# Patient Record
Sex: Female | Born: 1960 | Race: Black or African American | Hispanic: No | Marital: Married | Smoking: Never smoker
Health system: Southern US, Community
[De-identification: ages and names within clinical notes are randomized; demographics above are authoritative.]

---

## 2000-12-27 ENCOUNTER — Ambulatory Visit (HOSPITAL_COMMUNITY): Admission: RE | Admit: 2000-12-27 | Discharge: 2000-12-27 | Payer: Self-pay | Admitting: Obstetrics and Gynecology

## 2000-12-27 ENCOUNTER — Encounter: Payer: Self-pay | Admitting: Obstetrics and Gynecology

## 2000-12-28 ENCOUNTER — Other Ambulatory Visit: Admission: RE | Admit: 2000-12-28 | Discharge: 2000-12-28 | Payer: Self-pay | Admitting: Obstetrics and Gynecology

## 2002-03-16 ENCOUNTER — Encounter: Payer: Self-pay | Admitting: Obstetrics and Gynecology

## 2002-03-16 ENCOUNTER — Ambulatory Visit (HOSPITAL_COMMUNITY): Admission: RE | Admit: 2002-03-16 | Discharge: 2002-03-16 | Payer: Self-pay | Admitting: Obstetrics and Gynecology

## 2002-03-28 ENCOUNTER — Other Ambulatory Visit: Admission: RE | Admit: 2002-03-28 | Discharge: 2002-03-28 | Payer: Self-pay | Admitting: Obstetrics and Gynecology

## 2003-03-26 ENCOUNTER — Ambulatory Visit (HOSPITAL_COMMUNITY): Admission: RE | Admit: 2003-03-26 | Discharge: 2003-03-26 | Payer: Self-pay | Admitting: Obstetrics and Gynecology

## 2003-03-27 ENCOUNTER — Other Ambulatory Visit: Admission: RE | Admit: 2003-03-27 | Discharge: 2003-03-27 | Payer: Self-pay | Admitting: Obstetrics and Gynecology

## 2004-04-02 ENCOUNTER — Ambulatory Visit (HOSPITAL_COMMUNITY): Admission: RE | Admit: 2004-04-02 | Discharge: 2004-04-02 | Payer: Self-pay | Admitting: Obstetrics and Gynecology

## 2004-04-04 ENCOUNTER — Other Ambulatory Visit: Admission: RE | Admit: 2004-04-04 | Discharge: 2004-04-04 | Payer: Self-pay | Admitting: Obstetrics and Gynecology

## 2005-04-03 ENCOUNTER — Ambulatory Visit (HOSPITAL_COMMUNITY): Admission: RE | Admit: 2005-04-03 | Discharge: 2005-04-03 | Payer: Self-pay | Admitting: Obstetrics and Gynecology

## 2005-04-21 ENCOUNTER — Other Ambulatory Visit: Admission: RE | Admit: 2005-04-21 | Discharge: 2005-04-21 | Payer: Self-pay | Admitting: Obstetrics and Gynecology

## 2006-04-15 ENCOUNTER — Ambulatory Visit (HOSPITAL_COMMUNITY): Admission: RE | Admit: 2006-04-15 | Discharge: 2006-04-15 | Payer: Self-pay | Admitting: Obstetrics and Gynecology

## 2006-04-27 ENCOUNTER — Encounter: Admission: RE | Admit: 2006-04-27 | Discharge: 2006-04-27 | Payer: Self-pay | Admitting: Obstetrics and Gynecology

## 2006-05-11 ENCOUNTER — Other Ambulatory Visit: Admission: RE | Admit: 2006-05-11 | Discharge: 2006-05-11 | Payer: Self-pay | Admitting: Obstetrics and Gynecology

## 2007-04-27 ENCOUNTER — Ambulatory Visit (HOSPITAL_COMMUNITY): Admission: RE | Admit: 2007-04-27 | Discharge: 2007-04-27 | Payer: Self-pay | Admitting: Obstetrics and Gynecology

## 2010-02-02 ENCOUNTER — Encounter: Payer: Self-pay | Admitting: Obstetrics and Gynecology

## 2010-08-27 ENCOUNTER — Encounter (HOSPITAL_BASED_OUTPATIENT_CLINIC_OR_DEPARTMENT_OTHER)
Admission: RE | Admit: 2010-08-27 | Discharge: 2010-08-27 | Disposition: A | Discharge status: Home / Self Care | Payer: 59 | Source: Ambulatory Visit | Attending: Podiatry | Admitting: Podiatry

## 2010-08-28 ENCOUNTER — Ambulatory Visit (HOSPITAL_BASED_OUTPATIENT_CLINIC_OR_DEPARTMENT_OTHER)
Admission: RE | Admit: 2010-08-28 | Discharge: 2010-08-28 | Disposition: A | Discharge status: Home / Self Care | Payer: 59 | Source: Ambulatory Visit | Attending: Podiatry | Admitting: Podiatry

## 2010-08-28 DIAGNOSIS — Z01812 Encounter for preprocedural laboratory examination: Secondary | ICD-10-CM | POA: Insufficient documentation

## 2010-08-28 DIAGNOSIS — Z01810 Encounter for preprocedural cardiovascular examination: Secondary | ICD-10-CM | POA: Insufficient documentation

## 2010-08-28 DIAGNOSIS — M2040 Other hammer toe(s) (acquired), unspecified foot: Secondary | ICD-10-CM | POA: Insufficient documentation

## 2010-10-08 ENCOUNTER — Ambulatory Visit (HOSPITAL_COMMUNITY)
Admission: RE | Admit: 2010-10-08 | Discharge: 2010-10-08 | Disposition: A | Discharge status: Home / Self Care | Payer: 59 | Source: Ambulatory Visit | Attending: Gastroenterology | Admitting: Gastroenterology

## 2010-10-08 DIAGNOSIS — G4700 Insomnia, unspecified: Secondary | ICD-10-CM | POA: Insufficient documentation

## 2010-10-08 DIAGNOSIS — R198 Other specified symptoms and signs involving the digestive system and abdomen: Secondary | ICD-10-CM | POA: Insufficient documentation

## 2010-10-08 DIAGNOSIS — K5900 Constipation, unspecified: Secondary | ICD-10-CM | POA: Insufficient documentation

## 2010-10-08 DIAGNOSIS — Z79899 Other long term (current) drug therapy: Secondary | ICD-10-CM | POA: Insufficient documentation

## 2010-10-10 ENCOUNTER — Other Ambulatory Visit: Payer: Self-pay | Admitting: Gastroenterology

## 2010-10-13 ENCOUNTER — Other Ambulatory Visit (HOSPITAL_COMMUNITY): Payer: Self-pay | Admitting: Gastroenterology

## 2010-10-13 DIAGNOSIS — K6289 Other specified diseases of anus and rectum: Secondary | ICD-10-CM

## 2010-10-14 ENCOUNTER — Other Ambulatory Visit: Payer: 59

## 2010-10-16 ENCOUNTER — Ambulatory Visit (HOSPITAL_COMMUNITY)
Admission: RE | Admit: 2010-10-16 | Discharge: 2010-10-16 | Disposition: A | Discharge status: Home / Self Care | Payer: 59 | Source: Ambulatory Visit | Attending: Gastroenterology | Admitting: Gastroenterology

## 2010-10-16 DIAGNOSIS — K6289 Other specified diseases of anus and rectum: Secondary | ICD-10-CM

## 2010-10-16 DIAGNOSIS — R935 Abnormal findings on diagnostic imaging of other abdominal regions, including retroperitoneum: Secondary | ICD-10-CM | POA: Insufficient documentation

## 2010-10-16 MED ORDER — IOHEXOL 300 MG/ML  SOLN
100.0000 mL | Freq: Once | INTRAMUSCULAR | Status: AC | PRN
  Administered 2010-10-16: 100 mL via INTRAVENOUS

## 2015-02-06 MED FILL — MEDROXYPROGESTERONE 5 MG TA: 5 | 90 days supply | Qty: 90 | Fill #4

## 2015-02-06 MED FILL — ESTRADIOL 2 MG TABLET: 2 | 90 days supply | Qty: 90 | Fill #4

## 2015-05-07 MED FILL — MEDROXYPROGESTERONE 5 MG TA: 5 | 90 days supply | Qty: 90 | Fill #0

## 2015-05-09 MED FILL — ESTRADIOL 2 MG TABLET: 2 | 90 days supply | Qty: 90 | Fill #0

## 2015-08-07 MED FILL — MEDROXYPROGESTERONE 5 MG TA: 5 | 90 days supply | Qty: 90 | Fill #1

## 2015-08-07 MED FILL — ESTRADIOL 2 MG TABLET: 2 | 90 days supply | Qty: 90 | Fill #1

## 2015-09-18 ENCOUNTER — Emergency Department (HOSPITAL_COMMUNITY)
Admission: EM | Admit: 2015-09-18 | Discharge: 2015-09-18 | Disposition: A | Discharge status: Home / Self Care | Payer: PRIVATE HEALTH INSURANCE | Attending: Emergency Medicine | Admitting: Emergency Medicine

## 2015-09-18 ENCOUNTER — Encounter (HOSPITAL_COMMUNITY): Payer: Self-pay | Admitting: Emergency Medicine

## 2015-09-18 DIAGNOSIS — Y929 Unspecified place or not applicable: Secondary | ICD-10-CM | POA: Diagnosis not present

## 2015-09-18 DIAGNOSIS — R51 Headache: Secondary | ICD-10-CM | POA: Diagnosis present

## 2015-09-18 DIAGNOSIS — W19XXXA Unspecified fall, initial encounter: Secondary | ICD-10-CM

## 2015-09-18 DIAGNOSIS — Y990 Civilian activity done for income or pay: Secondary | ICD-10-CM | POA: Diagnosis not present

## 2015-09-18 DIAGNOSIS — W109XXA Fall (on) (from) unspecified stairs and steps, initial encounter: Secondary | ICD-10-CM | POA: Insufficient documentation

## 2015-09-18 DIAGNOSIS — W01198A Fall on same level from slipping, tripping and stumbling with subsequent striking against other object, initial encounter: Secondary | ICD-10-CM | POA: Insufficient documentation

## 2015-09-18 DIAGNOSIS — Y939 Activity, unspecified: Secondary | ICD-10-CM | POA: Diagnosis not present

## 2015-09-18 NOTE — Discharge Instructions (Signed)
As discussed, your evaluation today has been largely reassuring.  But, it is important that you monitor your condition carefully, and do not hesitate to return to the ED if you develop new, or concerning changes in your condition. ? ?Otherwise, please follow-up with your physician for appropriate ongoing care. ? ?

## 2015-09-18 NOTE — ED Provider Notes (Signed)
[facility] DEPT Provider Note   CSN: [id number] Arrival date & time: 09/18/15  1146     History   Chief Complaint Chief Complaint  Patient presents with  . Fall    HPI Valerie Spence is a 55 y.o. female.  HPI Patient presents after mechanical fall. Patient recall the entirety of the event. Patient was in her usual state of health prior to falling. She recalls slipping on water, falling onto her backside, subsequent hitting her head. No loss of consciousness, no subsequent confusion, disorientation, vision changes. She did have pain in the posterior head briefly after the event, though has no current complaints, including no head pain, neck pain, weakness in any extremity. No medication taken for relief.  History reviewed. No pertinent past medical history.  There are no active problems to display for this patient.   History reviewed. No pertinent surgical history.  OB History    No data available       Home Medications    Prior to Admission medications   Not on File    Family History No family history on file.  Social History Social History  Substance Use Topics  . Smoking status: Not on file  . Smokeless tobacco: Not on file  . Alcohol use Not on file     Allergies   Review of patient's allergies indicates not on file.   Review of Systems Review of Systems  Constitutional:       Per HPI, otherwise negative  HENT:       Per HPI, otherwise negative  Respiratory:       Per HPI, otherwise negative  Cardiovascular:       Per HPI, otherwise negative  Gastrointestinal: Negative for vomiting.  Endocrine:       Negative aside from HPI  Genitourinary:       Neg aside from HPI   Musculoskeletal:       Per HPI, otherwise negative  Skin: Negative.   Neurological: Positive for headaches. Negative for syncope.     Physical Exam Updated Vital Signs BP (!) 128/53 (BP Location: Left Arm)   Pulse (!) 54   Temp 97.9 F (36.6 C) (Oral)   Resp 16    SpO2 100%   Physical Exam  Constitutional: She is oriented to person, place, and time. She appears well-developed and well-nourished. No distress.  HENT:  Head: Normocephalic and atraumatic.  Eyes: Conjunctivae and EOM are normal.  Neck: No spinous process tenderness and no muscular tenderness present. No neck rigidity. Normal range of motion present.  Cardiovascular: Normal rate and regular rhythm.   Pulmonary/Chest: Effort normal and breath sounds normal. No stridor. No respiratory distress.  Abdominal: She exhibits no distension.  Musculoskeletal: She exhibits no edema.  Neurological: She is alert and oriented to person, place, and time. She displays no atrophy and no tremor. No cranial nerve deficit. She exhibits normal muscle tone. She displays no seizure activity.  Skin: Skin is warm and dry.  Psychiatric: She has a normal mood and affect.  Nursing note and vitals reviewed.    ED Treatments / Results   Procedures Procedures (including critical care time)   Initial Impression / Assessment and Plan / ED Course  I have reviewed the triage vital signs and the nursing notes.  Pertinent labs & imaging results that were available during my care of the patient were reviewed by me and considered in my medical decision making (see chart for details).  Well-appearing female presents after mechanical fall.  Patient has full recall of the event, has no ongoing pain, nor neurologic deficits. Patient takes no blood thinning medication. With reassuring physical exam, unremarkable vitals imaging is not indicated. Patient discharged in stable condition.   Carmin Muskrat, MD 09/18/15 1246

## 2015-09-18 NOTE — ED Notes (Signed)
Bed: [id number] Expected date:  Expected time:  Means of arrival:  Comments: [employer] Employee

## 2015-09-18 NOTE — ED Triage Notes (Signed)
Pt was upstairs walking, slipped on water and had a fall. Pt fell backwards and hit back of head on wall. No LOC or blood thinners. Pt denies pain, had an initial headache that is now gone.

## 2015-09-18 NOTE — Progress Notes (Signed)
Pt confirms pcp as [name],  Pt fell at work. Pt noted to ambulate to door  Pt with primary care giver at home if needed, assistance to get medications, food and to f/u care  As CM was speaking with pt ED NP/AP came to evaluate pt

## 2015-09-19 ENCOUNTER — Telehealth: Payer: Self-pay | Admitting: *Deleted

## 2015-11-06 MED FILL — MEDROXYPROGESTERONE 5 MG TA: 5 | 90 days supply | Qty: 90 | Fill #2

## 2015-11-06 MED FILL — ESTRADIOL 2 MG TABLET: 2 | 90 days supply | Qty: 90 | Fill #2

## 2015-12-10 DIAGNOSIS — Z0000 Encounter for general adult medical examination without abnormal findings: Secondary | ICD-10-CM | POA: Diagnosis not present

## 2015-12-10 DIAGNOSIS — Z13220 Encounter for screening for lipoid disorders: Secondary | ICD-10-CM | POA: Diagnosis not present

## 2015-12-10 DIAGNOSIS — Z130 Encounter for screening for diseases of the blood and blood-forming organs and certain disorders involving the immune mechanism: Secondary | ICD-10-CM | POA: Diagnosis not present

## 2016-01-03 ENCOUNTER — Other Ambulatory Visit (HOSPITAL_COMMUNITY): Payer: Self-pay | Admitting: Orthopedic Surgery

## 2016-01-03 DIAGNOSIS — M5416 Radiculopathy, lumbar region: Secondary | ICD-10-CM

## 2016-01-07 ENCOUNTER — Other Ambulatory Visit (HOSPITAL_COMMUNITY): Payer: Self-pay | Admitting: Orthopedic Surgery

## 2016-01-07 DIAGNOSIS — M5416 Radiculopathy, lumbar region: Secondary | ICD-10-CM

## 2016-01-11 ENCOUNTER — Ambulatory Visit (HOSPITAL_BASED_OUTPATIENT_CLINIC_OR_DEPARTMENT_OTHER)
Admission: RE | Admit: 2016-01-11 | Discharge: 2016-01-11 | Disposition: A | Discharge status: Home / Self Care | Payer: PRIVATE HEALTH INSURANCE | Source: Ambulatory Visit | Attending: Orthopedic Surgery | Admitting: Orthopedic Surgery

## 2016-01-11 ENCOUNTER — Encounter (HOSPITAL_BASED_OUTPATIENT_CLINIC_OR_DEPARTMENT_OTHER): Payer: Self-pay

## 2016-01-11 DIAGNOSIS — M5116 Intervertebral disc disorders with radiculopathy, lumbar region: Secondary | ICD-10-CM | POA: Insufficient documentation

## 2016-01-11 DIAGNOSIS — M48061 Spinal stenosis, lumbar region without neurogenic claudication: Secondary | ICD-10-CM | POA: Diagnosis not present

## 2016-01-11 DIAGNOSIS — M5416 Radiculopathy, lumbar region: Secondary | ICD-10-CM | POA: Diagnosis present

## 2016-01-21 MED FILL — METHOCARBAMOL 500 MG TABLET: 500 | 20 days supply | Qty: 60 | Fill #0

## 2016-01-21 MED FILL — METHYLPREDNISOLONE 4 MG TAB: 4 | 6 days supply | Qty: 21 | Fill #0

## 2016-02-03 MED FILL — ESTRADIOL 2 MG TABLET: 2 | 90 days supply | Qty: 90 | Fill #3

## 2016-02-04 MED FILL — MEDROXYPROGESTERONE 5 MG TA: 5 | 90 days supply | Qty: 90 | Fill #0

## 2016-02-11 ENCOUNTER — Ambulatory Visit: Payer: PRIVATE HEALTH INSURANCE | Attending: Family Medicine | Admitting: Physical Therapy

## 2016-02-11 DIAGNOSIS — M25652 Stiffness of left hip, not elsewhere classified: Secondary | ICD-10-CM | POA: Diagnosis present

## 2016-02-11 DIAGNOSIS — R208 Other disturbances of skin sensation: Secondary | ICD-10-CM | POA: Diagnosis present

## 2016-02-11 DIAGNOSIS — R252 Cramp and spasm: Secondary | ICD-10-CM | POA: Diagnosis present

## 2016-02-11 DIAGNOSIS — M79605 Pain in left leg: Secondary | ICD-10-CM | POA: Diagnosis present

## 2016-02-11 NOTE — Therapy (Signed)
[place], [place], [address] Phone: [phone]   Fax:  [phone]  Physical Therapy Evaluation  Patient Details  Name: Valerie Spence MRN: [id number] Date of Birth: [birth year] Referring Provider: Dr. Lynann Bologna  Encounter Date: 02/11/2016      PT End of Session - 02/11/16 1842    Visit Number 1   Number of Visits 12   Date for PT Re-Evaluation 04/07/16   PT Start Time 2563   PT Stop Time 1635   PT Time Calculation (min) 53 min   Activity Tolerance Patient tolerated treatment well   Behavior During Therapy [facility] for tasks assessed/performed      No past medical history on file.  No past surgical history on file.  There were no vitals filed for this visit.       Subjective Assessment - 02/11/16 1547    Subjective Pt fell at work in Sept. 2017, slipped on wet floor and fell on her bottom and the back of her head hit the wall.  She has had burning pain and tingling in LLE since Nov.  in LLE.  She denies weakness.  This prevents her from sitting any length of time.  This impacts her ability to finish her work, has to change positions frequently.  She is able to do her normal routine at home.    Limitations Sitting;Other (comment)   How long can you sit comfortably? 15 min max    Diagnostic tests MRI showed L4-L5 disc Soft disc protrusion at L4-5 asymmetric to the left   Patient Stated Goals Pt would to alleviate her pain completely.    Currently in Pain? No/denies   Pain Score 9   when she puts pressure in it in sitting    Pain Location Leg   Pain Orientation Left;Posterior;Proximal   Pain Descriptors / Indicators Burning;Tingling;Shooting   Pain Type Chronic pain   Pain Radiating Towards knee    Pain Onset More than a month ago   Pain Frequency Intermittent   Aggravating Factors  sitting    Pain Relieving Factors standing    Effect of Pain on Daily Activities slows her down, pain can be  excruciating             [facility] PT Assessment - 02/11/16 1555      Assessment   Medical Diagnosis lumbar radiculopathy   Referring Provider Dr. Lynann Bologna   Onset Date/Surgical Date 09/18/15   Prior Therapy No      Precautions   Precautions None     Restrictions   Weight Bearing Restrictions No     Balance Screen   Has the patient fallen in the past 6 months Yes     [place] residence     Prior Function   Level of Independence Independent     Cognition   Overall Cognitive Status Within Functional Limits for tasks assessed     Observation/Other Assessments   Focus on Therapeutic Outcomes (FOTO)  NT     Sensation   Light Touch Appears Intact     Posture/Postural Control   Posture Comments Rt. iliac crest higher and L leg shorter      AROM   Right/Left Hip --  WNL    Lumbar Flexion WNL   Lumbar Extension WNL    Lumbar - Right Side Bend WNL   Lumbar - Left Side Bend WNL   Lumbar - Right Rotation WNL   Lumbar -  Left Rotation WNL     PROM   Overall PROM Comments Rt. 85, Lt 70 deg      Strength   Right Hip Flexion 5/5   Right Hip Extension 4+/5   Right Hip ABduction 4/5   Left Hip Flexion 5/5   Left Hip Extension 4/5   Left Hip ABduction 3+/5   Right Knee Flexion 5/5   Right Knee Extension 5/5   Left Knee Flexion 5/5   Left Knee Extension 5/5   Right Ankle Dorsiflexion 5/5   Left Ankle Dorsiflexion 5/5     Flexibility   Hamstrings 90/90 Rt. 18, Lt 35   Piriformis tight      Palpation   Spinal mobility normal, discomfort but no pain    SI assessment  no pain, Lt ASIS lower    Palpation comment crepitus in knees with ext., MMT      FABER test   findings Negative     Prone Knee Bend Test   Findings Negative     Straight Leg Raise   Findings Negative                   [facility] Adult PT Treatment/Exercise - 02/11/16 1555      Lumbar Exercises: Stretches   Active Hamstring Stretch 1 rep   Prone on  Elbows Stretch 1 rep;60 seconds   Piriformis Stretch 1 rep     Manual Therapy   Muscle Energy Technique Resisted hip flexion x 8 sec x 10 in supine                 PT Education - 02/11/16 1842    Education provided Yes   Education Details PT/POC, HEP and SIJ, anatomy, directional preferences    Person(s) Educated Patient   Methods Explanation;Demonstration;Tactile cues;Verbal cues;Handout   Comprehension Verbalized understanding;Need further instruction          PT Short Term Goals - 02/11/16 1854      PT SHORT TERM GOAL #1   Title Pt will be I with initial HEP for lumbar, core stability anf flexibility.    Time 3   Period Weeks   Status New     PT SHORT TERM GOAL #2   Title Pt will report ability to sit for 15 min (for commute to work) with only min pain in LE    Time 3   Period Weeks   Status New     PT SHORT TERM GOAL #3   Title Pt wil complete FOTO and set goal for measurement of functional progress.    Time 1   Period Weeks   Status New           PT Long Term Goals - 02/11/16 1856      PT LONG TERM GOAL #1   Title Pt will be able to sit as long as needed for work activity (notes), driving and pain in LE <3/10.   Time 8   Period Weeks   Status New     PT LONG TERM GOAL #2   Title Pt will be I with more advanced HEP and concepts of posture and body mechanics to prevent further injury.    Time 8   Period Weeks   Status New     PT LONG TERM GOAL #3   Title Pt will demo LLE hamstring 90/90 test within 5 deg of each other    Baseline Rt. 18, Lt. 35   Time 8   Period  Weeks   Status New     PT LONG TERM GOAL #4   Title Pt will demo 4+/5 or more hip abd and ext strength to maximize stability in gait, exercise.     Baseline 3+/5 to 4/5   Time 8   Period Weeks   Status New     PT LONG TERM GOAL #5   Title FOTO goal to be set when avail.                Plan - 02/11/16 1843    Clinical Impression Statement Patient presents for low  complexity eval of lumbar radiculopathy with only LLE symptoms in seated position.  She can eliminate her pain with standing.  She does also have an asymmetry of her leg length and bony landmarks.  Today I performed a MET to correct a post innominate on L side, this may be part of the issue as she did fall on her backside.  Mild weakness in L gluteals and she has decent lower abdominal strength.  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    Rehab Potential Excellent   PT Frequency 2x / week   PT Duration 8 weeks  allow 8 weeks for 12 visit    PT Treatment/Interventions ADLs/Self Care Home Management;Cryotherapy;Electrical Stimulation;Functional mobility training;Passive range of motion;Neuromuscular re-education;Ultrasound;Traction;Manual techniques;Therapeutic activities;Moist Heat;Taping   PT Next Visit Plan check HEP, manual, see if MET made a diff (check Leg length)    PT Home Exercise Plan hamstring, piriformis and MET, prone ext    Consulted and Agree with Plan of Care Patient      Patient will benefit from skilled therapeutic intervention in order to improve the following deficits and impairments:  Increased muscle spasms, Pain, Impaired flexibility, Decreased mobility, Decreased strength, Postural dysfunction, Impaired sensation  Visit Diagnosis: Pain in left leg  Other disturbances of skin sensation  Cramp and spasm  Stiffness of left hip, not elsewhere classified     Problem List There are no active problems to display for this patient.   Mega Kinkade 02/11/2016, 7:20 PM  [facility] [address] [place], [place], [address] Phone: [phone]   Fax:  [phone]  Name: Valerie Spence MRN: [id number] Date of Birth: [birth year]

## 2016-02-14 DIAGNOSIS — Z1231 Encounter for screening mammogram for malignant neoplasm of breast: Secondary | ICD-10-CM | POA: Diagnosis not present

## 2016-02-14 DIAGNOSIS — Z6822 Body mass index (BMI) 22.0-22.9, adult: Secondary | ICD-10-CM | POA: Diagnosis not present

## 2016-02-14 DIAGNOSIS — Z01419 Encounter for gynecological examination (general) (routine) without abnormal findings: Secondary | ICD-10-CM | POA: Diagnosis not present

## 2016-02-14 DIAGNOSIS — Z124 Encounter for screening for malignant neoplasm of cervix: Secondary | ICD-10-CM | POA: Diagnosis not present

## 2016-02-17 ENCOUNTER — Ambulatory Visit: Payer: PRIVATE HEALTH INSURANCE | Attending: Family Medicine | Admitting: Physical Therapy

## 2016-02-17 DIAGNOSIS — R208 Other disturbances of skin sensation: Secondary | ICD-10-CM | POA: Diagnosis present

## 2016-02-17 DIAGNOSIS — M25652 Stiffness of left hip, not elsewhere classified: Secondary | ICD-10-CM | POA: Insufficient documentation

## 2016-02-17 DIAGNOSIS — R252 Cramp and spasm: Secondary | ICD-10-CM | POA: Diagnosis present

## 2016-02-17 DIAGNOSIS — M79605 Pain in left leg: Secondary | ICD-10-CM | POA: Insufficient documentation

## 2016-02-17 NOTE — Therapy (Signed)
[place], [place], [address] Phone: [phone]   Fax:  [phone]  Physical Therapy Treatment  Patient Details  Name: Valerie Spence MRN: [id number] Date of Birth: [birth year] Referring Provider: Dr. Lynann Bologna  Encounter Date: 02/17/2016      PT End of Session - 02/17/16 1350    Visit Number 2   Number of Visits 12   Date for PT Re-Evaluation 04/07/16   PT Start Time 5329   PT Stop Time 1418   PT Time Calculation (min) 43 min   Activity Tolerance Patient tolerated treatment well   Behavior During Therapy [facility] for tasks assessed/performed      No past medical history on file.  No past surgical history on file.  There were no vitals filed for this visit.      Subjective Assessment - 02/17/16 1340    Subjective The exercises helped her, including the MET.   No pain today.     Currently in Pain? No/denies            [facility] Adult PT Treatment/Exercise - 02/17/16 1344      Lumbar Exercises: Stretches   Active Hamstring Stretch 2 reps;30 seconds   Active Hamstring Stretch Limitations seated   Pelvic Tilt 5 reps   Piriformis Stretch 3 reps;30 seconds     Lumbar Exercises: Supine   Ab Set 10 reps   Clam 10 reps   Heel Slides 10 reps   Bent Knee Raise 10 reps   Bridge 10 reps     Manual Therapy   Joint Mobilization L hip add and flex for post capsule stretch    Soft tissue mobilization L piriformis and hamstring (pin and stretch) L hip    Passive ROM L hip ER and IR    Manual Traction LLT long axis distaction, oscillation    Muscle Energy Technique did not do today, appeared level                 PT Education - 02/17/16 1810    Education provided Yes   Education Details HEP, lumabr stabilization and alternate piriformis stretch    Person(s) Educated Patient   Methods Explanation;Handout   Comprehension Verbalized understanding          PT Short Term Goals - 02/11/16 1854       PT SHORT TERM GOAL #1   Title Pt will be I with initial HEP for lumbar, core stability anf flexibility.    Time 3   Period Weeks   Status New     PT SHORT TERM GOAL #2   Title Pt will report ability to sit for 15 min (for commute to work) with only min pain in LE    Time 3   Period Weeks   Status New     PT SHORT TERM GOAL #3   Title Pt wil complete FOTO and set goal for measurement of functional progress.    Time 1   Period Weeks   Status New           PT Long Term Goals - 02/11/16 1856      PT LONG TERM GOAL #1   Title Pt will be able to sit as long as needed for work activity (notes), driving and pain in LE <3/10.   Time 8   Period Weeks   Status New     PT LONG TERM GOAL #2   Title Pt will be I with more  advanced HEP and concepts of posture and body mechanics to prevent further injury.    Time 8   Period Weeks   Status New     PT LONG TERM GOAL #3   Title Pt will demo LLE hamstring 90/90 test within 5 deg of each other    Baseline Rt. 18, Lt. 35   Time 8   Period Weeks   Status New     PT LONG TERM GOAL #4   Title Pt will demo 4+/5 or more hip abd and ext strength to maximize stability in gait, exercise.     Baseline 3+/5 to 4/5   Time 8   Period Weeks   Status New     PT LONG TERM GOAL #5   Title FOTO goal to be set when avail.                Plan - 02/17/16 1350    Clinical Impression Statement Pt attends her 1st treatment session without pain.  Tolerated simple L stab exercises with good technique and no increase in pain.  Able to effectively mobilize hip.  Leg length appeared equal today,.    PT Next Visit Plan progress core and low abd stability , manual to L hip,   PT Home Exercise Plan hamstring, piriformis and MET, prone ext , L stab 1   Consulted and Agree with Plan of Care Patient      Patient will benefit from skilled therapeutic intervention in order to improve the following deficits and impairments:  Increased muscle  spasms, Pain, Impaired flexibility, Decreased mobility, Decreased strength, Postural dysfunction, Impaired sensation  Visit Diagnosis: Pain in left leg  Other disturbances of skin sensation  Cramp and spasm  Stiffness of left hip, not elsewhere classified     Problem List There are no active problems to display for this patient.   PAA,JENNIFER 02/17/2016, 6:14 PM  [place], [place], [address] Phone: [phone]   Fax:  [phone]  Name: Valerie Spence MRN: [id number] Date of Birth: May 06, 1960   Raeford Razor, PT 02/17/16 6:14 PM Phone: [phone] Fax: [phone]

## 2016-02-18 ENCOUNTER — Ambulatory Visit: Payer: PRIVATE HEALTH INSURANCE | Admitting: Physical Therapy

## 2016-02-18 DIAGNOSIS — R208 Other disturbances of skin sensation: Secondary | ICD-10-CM

## 2016-02-18 DIAGNOSIS — M79605 Pain in left leg: Secondary | ICD-10-CM | POA: Diagnosis not present

## 2016-02-18 DIAGNOSIS — M25652 Stiffness of left hip, not elsewhere classified: Secondary | ICD-10-CM

## 2016-02-18 DIAGNOSIS — R252 Cramp and spasm: Secondary | ICD-10-CM

## 2016-02-18 NOTE — Therapy (Signed)
[place], [place], [address] Phone: [phone]   Fax:  [phone]  Physical Therapy Treatment  Patient Details  Name: Valerie Spence MRN: [id number] Date of Birth: 02-20-1960 Referring Provider: Dr. Lynann Bologna  Encounter Date: 02/18/2016      PT End of Session - 02/18/16 1458    Visit Number 3   Number of Visits 12   Date for PT Re-Evaluation 04/07/16   PT Start Time 0217   PT Stop Time 0315   PT Time Calculation (min) 58 min      No past medical history on file.  No past surgical history on file.  There were no vitals filed for this visit.      Subjective Assessment - 02/18/16 1336    Subjective I have been sitting and standing today. I take robaxin and that helps as well as positioning.    Currently in Pain? No/denies            [facility] PT Assessment - 02/18/16 0001      Flexibility   Hamstrings Rt 0 Lt @ 90/90                      [facility] Adult PT Treatment/Exercise - 02/18/16 0001      Lumbar Exercises: Stretches   Active Hamstring Stretch 2 reps;30 seconds   Active Hamstring Stretch Limitations seated   Piriformis Stretch 3 reps;30 seconds     Lumbar Exercises: Supine   Clam 10 reps   Heel Slides 10 reps   Bent Knee Raise 10 reps     Manual Therapy   Joint Mobilization L hip add and flex for post capsule stretch    Soft tissue mobilization proximal hamstring   Passive ROM L hip ER and IR    Manual Traction LLT long axis distaction, oscillation    Muscle Energy Technique did not do today, appeared level                 PT Education - 02/17/16 1810    Education provided Yes   Education Details HEP, lumabr stabilization and alternate piriformis stretch    Person(s) Educated Patient   Methods Explanation;Handout   Comprehension Verbalized understanding          PT Short Term Goals - 02/18/16 1448      PT SHORT TERM GOAL #1   Title Pt will be I  with initial HEP for lumbar, core stability anf flexibility.    Time 3   Period Weeks   Status On-going     PT SHORT TERM GOAL #2   Title Pt will report ability to sit for 15 min (for commute to work) with only min pain in LE    Time 3   Period Weeks   Status Achieved     PT SHORT TERM GOAL #3   Title Pt wil complete FOTO and set goal for measurement of functional progress.    Time 1   Period Weeks   Status Unable to assess           PT Long Term Goals - 02/11/16 1856      PT LONG TERM GOAL #1   Title Pt will be able to sit as long as needed for work activity (notes), driving and pain in LE <3/10.   Time 8   Period Weeks   Status New     PT LONG TERM GOAL #2   Title Pt  will be I with more advanced HEP and concepts of posture and body mechanics to prevent further injury.    Time 8   Period Weeks   Status New     PT LONG TERM GOAL #3   Title Pt will demo LLE hamstring 90/90 test within 5 deg of each other    Baseline Rt. 18, Lt. 35   Time 8   Period Weeks   Status New     PT LONG TERM GOAL #4   Title Pt will demo 4+/5 or more hip abd and ext strength to maximize stability in gait, exercise.     Baseline 3+/5 to 4/5   Time 8   Period Weeks   Status New     PT LONG TERM GOAL #5   Title FOTO goal to be set when avail.                Plan - 02/18/16 1446    Clinical Impression Statement Pt feels she is making improvements. She can sit for 15 minutes without STG#2 Met. Review of entire HEP. Hamstring length improved.    PT Next Visit Plan progress core and low abd stability , continue manual/ mobs  to L hip,   PT Home Exercise Plan hamstring, piriformis and MET, prone ext , L stab 1   Consulted and Agree with Plan of Care Patient      Patient will benefit from skilled therapeutic intervention in order to improve the following deficits and impairments:  Increased muscle spasms, Pain, Impaired flexibility, Decreased mobility, Decreased strength, Postural  dysfunction, Impaired sensation  Visit Diagnosis: Pain in left leg  Cramp and spasm  Stiffness of left hip, not elsewhere classified  Other disturbances of skin sensation     Problem List There are no active problems to display for this patient.   Dorene Ar, [place] 02/18/2016, 4:45 PM  [facility] [address] [place], [place], [address] Phone: [phone]   Fax:  [phone]  Name: Valerie Spence MRN: [id number] Date of Birth: 12-17-1960

## 2016-02-24 ENCOUNTER — Ambulatory Visit: Payer: PRIVATE HEALTH INSURANCE | Admitting: Physical Therapy

## 2016-02-24 DIAGNOSIS — M79605 Pain in left leg: Secondary | ICD-10-CM

## 2016-02-24 DIAGNOSIS — R252 Cramp and spasm: Secondary | ICD-10-CM

## 2016-02-24 DIAGNOSIS — R208 Other disturbances of skin sensation: Secondary | ICD-10-CM

## 2016-02-24 DIAGNOSIS — M25652 Stiffness of left hip, not elsewhere classified: Secondary | ICD-10-CM

## 2016-02-24 NOTE — Patient Instructions (Signed)
Pelvic Rotation: Contract / Relax (Supine)    Hands against left knee, resist bent leg moving toward head. Press straight leg down. Hold __5__ seconds. Relax. Repeat __5__ times per set Do __1-4__ sessions per day.  Do to level hips,   1-4 [email or web address].[email or web address]   Copyright  VHI. All rights reserved.

## 2016-02-24 NOTE — Therapy (Signed)
[place], [place], [address] Phone: [phone]   Fax:  [phone]  Physical Therapy Treatment  Patient Details  Name: Valerie Spence MRN: [id number] Date of Birth: 08/18/1960 Referring Provider: Dr. Lynann Bologna  Encounter Date: 02/24/2016      PT End of Session - 02/24/16 1546    Visit Number 4   Number of Visits 12   Date for PT Re-Evaluation 04/07/16   PT Start Time 1504   PT Stop Time 1545   PT Time Calculation (min) 41 min   Activity Tolerance Patient tolerated treatment well   Behavior During Therapy [facility] for tasks assessed/performed      No past medical history on file.  No past surgical history on file.  There were no vitals filed for this visit.      Subjective Assessment - 02/24/16 1512    Subjective 5/10.  PT has helped a lot.  I am doing everything I can   Currently in Pain? Yes   Pain Score 5    Pain Location Leg   Pain Orientation Left;Posterior   Pain Descriptors / Indicators Burning;Tingling;Shooting   Pain Type Chronic pain   Pain Radiating Towards knee,  It has heppened 1 x  in 2 weeks   Pain Frequency Intermittent   Aggravating Factors  one position too long   Pain Relieving Factors standing   Effect of Pain on Daily Activities slows her down                         [facility] Adult PT Treatment/Exercise - 02/24/16 0001      Lumbar Exercises: Stretches   Passive Hamstring Stretch 3 reps;30 seconds   Passive Hamstring Stretch Limitations with strap   Piriformis Stretch 3 reps;30 seconds  knee to chest and figure 4     Lumbar Exercises: Supine   Bridge 10 reps   Straight Leg Raise 20 reps   Straight Leg Raises Limitations left hip only.  pops audible with no pain   Isometric Hip Flexion 5 reps;5 seconds;Other (comment)   Isometric Hip Flexion Limitations Left only to decrease rotation , HEP ( with opposite leg press.    Other Supine Lumbar Exercises Ball  squeeze 10 X 2 sets , 5 second hold     Manual Therapy   Passive ROM L hip ER and IR   feels good 3 reps each 10 seconds                PT Education - 02/24/16 1546    Education provided Yes   Education Details HEP,  anatomy   Person(s) Educated Patient   Methods Explanation;Tactile cues;Verbal cues;Handout   Comprehension Verbalized understanding;Returned demonstration          PT Short Term Goals - 02/24/16 1701      PT SHORT TERM GOAL #1   Title Pt will be I with initial HEP for lumbar, core stability anf flexibility.    Baseline independent with exercises issued so far   Time 3   Period Weeks   Status On-going     PT SHORT TERM GOAL #2   Title Pt will report ability to sit for 15 min (for commute to work) with only min pain in LE    Time 3   Period Weeks   Status Achieved           PT Long Term Goals - 02/24/16 1701  PT LONG TERM GOAL #1   Title Pt will be able to sit as long as needed for work activity (notes), driving and pain in LE <3/10.   Baseline sitting improving, however it is still limited,.. Has not had radiating pain this week.   Time 8   Period Weeks   Status On-going     PT LONG TERM GOAL #2   Title Pt will be I with more advanced HEP and concepts of posture and body mechanics to prevent further injury.    Time 8   Status On-going     PT LONG TERM GOAL #3   Title Pt will demo LLE hamstring 90/90 test within 5 deg of each other    Time 8   Period Weeks   Status Unable to assess     PT LONG TERM GOAL #4   Title Pt will demo 4+/5 or more hip abd and ext strength to maximize stability in gait, exercise.     Time 8   Status Unable to assess               Plan - 02/24/16 1659    Clinical Impression Statement Patient can assess if her hips are level. She is consistant with her HEP.  She is able to level hips with muscle energy.  She had no pain at the end of the session.   PT Next Visit Plan progress core and low abd  stability , continue manual/ mobs  to L hip,   PT Home Exercise Plan hamstring, piriformis and MET, prone ext , L stab 1,  back muscle energy  contract relax.   Consulted and Agree with Plan of Care Patient      Patient will benefit from skilled therapeutic intervention in order to improve the following deficits and impairments:  Increased muscle spasms, Pain, Impaired flexibility, Decreased mobility, Decreased strength, Postural dysfunction, Impaired sensation  Visit Diagnosis: Pain in left leg  Cramp and spasm  Stiffness of left hip, not elsewhere classified  Other disturbances of skin sensation     Problem List There are no active problems to display for this patient.   Iyauna Sing PTA 02/24/2016, 5:03 PM  [facility] [address] [place], [place], [address] Phone: [phone]   Fax:  [phone]  Name: Valerie Spence MRN: [id number] Date of Birth: 02-03-1960

## 2016-02-25 ENCOUNTER — Ambulatory Visit: Payer: PRIVATE HEALTH INSURANCE | Admitting: Physical Therapy

## 2016-02-25 DIAGNOSIS — M79605 Pain in left leg: Secondary | ICD-10-CM | POA: Diagnosis not present

## 2016-02-25 DIAGNOSIS — R208 Other disturbances of skin sensation: Secondary | ICD-10-CM

## 2016-02-25 DIAGNOSIS — R252 Cramp and spasm: Secondary | ICD-10-CM

## 2016-02-25 DIAGNOSIS — M25652 Stiffness of left hip, not elsewhere classified: Secondary | ICD-10-CM

## 2016-02-25 NOTE — Therapy (Signed)
[place], [place], [address] Phone: [phone]   Fax:  [phone]  Physical Therapy Treatment  Patient Details  Name: Valerie Spence MRN: [id number] Date of Birth: 1960-04-25 Referring Provider: Dr. Lynann Bologna  Encounter Date: 02/25/2016      PT End of Session - 02/25/16 1648    Visit Number 5   Number of Visits 12   Date for PT Re-Evaluation 04/07/16   PT Start Time 0737   PT Stop Time 1656   PT Time Calculation (min) 62 min   Activity Tolerance Patient tolerated treatment well   Behavior During Therapy [facility] for tasks assessed/performed      No past medical history on file.  No past surgical history on file.  There were no vitals filed for this visit.      Subjective Assessment - 02/25/16 1601    Subjective 5/10 Left gluteal crease.    Hips have been level.  things have been going as expected.    Currently in Pain? Yes   Pain Score 5    Pain Location Buttocks   Pain Orientation Left;Posterior  crease                         [facility] Adult PT Treatment/Exercise - 02/25/16 0001      Lumbar Exercises: Stretches   Passive Hamstring Stretch 3 reps;30 seconds   Passive Hamstring Stretch Limitations with strap     Cryotherapy   Number Minutes Cryotherapy 15 Minutes   Cryotherapy Location --  left groin   Type of Cryotherapy --  cold pack     Manual Therapy   Manual therapy comments gluteal fold soft tissue noted medial groin tenderness along sympsis pubis, able to reproduce pain.  Showed her how to get area with a foam roller.  Illiopsoas release with hips at 90 tender to palpation.     Passive ROM LT hip abduction, IR/ER    Muscle Energy Technique Right highter today, able to level with muscle energy % x 4 sesonds                PT Education - 02/25/16 1652    Education provided Yes   Education Details how to use pool noodle for soft tissue work.    Person(s)  Educated Patient   Methods Explanation;Tactile cues;Verbal cues   Comprehension Verbalized understanding;Returned demonstration          PT Short Term Goals - 02/24/16 1701      PT SHORT TERM GOAL #1   Title Pt will be I with initial HEP for lumbar, core stability anf flexibility.    Baseline independent with exercises issued so far   Time 3   Period Weeks   Status On-going     PT SHORT TERM GOAL #2   Title Pt will report ability to sit for 15 min (for commute to work) with only min pain in LE    Time 3   Period Weeks   Status Achieved           PT Long Term Goals - 02/24/16 1701      PT LONG TERM GOAL #1   Title Pt will be able to sit as long as needed for work activity (notes), driving and pain in LE <3/10.   Baseline sitting improving, however it is still limited,.. Has not had radiating pain this week.   Time 8   Period Weeks  Status On-going     PT LONG TERM GOAL #2   Title Pt will be I with more advanced HEP and concepts of posture and body mechanics to prevent further injury.    Time 8   Status On-going     PT LONG TERM GOAL #3   Title Pt will demo LLE hamstring 90/90 test within 5 deg of each other    Time 8   Period Weeks   Status Unable to assess     PT LONG TERM GOAL #4   Title Pt will demo 4+/5 or more hip abd and ext strength to maximize stability in gait, exercise.     Time 8   Status Unable to assess               Plan - 02/25/16 1649    Clinical Impression Statement Illiopsoas tender.  medial groin tender.  Stretching and cold and manual heplful.   hamstring Left from 90, 30 degrees improved 5 degrees.    PT Next Visit Plan Illiopsoas manual,  medial groin manual, cold pack PROM hip.   PT Home Exercise Plan hamstring, piriformis and MET, prone ext , L stab 1,  back muscle energy  contract relax.   Consulted and Agree with Plan of Care Patient      Patient will benefit from skilled therapeutic intervention in order to improve the  following deficits and impairments:  Increased muscle spasms, Pain, Impaired flexibility, Decreased mobility, Decreased strength, Postural dysfunction, Impaired sensation  Visit Diagnosis: Pain in left leg  Cramp and spasm  Stiffness of left hip, not elsewhere classified  Other disturbances of skin sensation     Problem List There are no active problems to display for this patient.   HARRIS,KAREN PTA 02/25/2016, 4:55 PM  [place], [place], [address] Phone: [phone]   Fax:  [phone]  Name: Valerie Spence MRN: [id number] Date of Birth: 02-29-1960

## 2016-02-25 NOTE — Patient Instructions (Signed)
Use cold at home 10-15 minutes.

## 2016-03-02 ENCOUNTER — Ambulatory Visit: Payer: PRIVATE HEALTH INSURANCE | Admitting: Physical Therapy

## 2016-03-02 DIAGNOSIS — R208 Other disturbances of skin sensation: Secondary | ICD-10-CM

## 2016-03-02 DIAGNOSIS — M79605 Pain in left leg: Secondary | ICD-10-CM

## 2016-03-02 DIAGNOSIS — M25652 Stiffness of left hip, not elsewhere classified: Secondary | ICD-10-CM

## 2016-03-02 DIAGNOSIS — R252 Cramp and spasm: Secondary | ICD-10-CM

## 2016-03-02 NOTE — Patient Instructions (Signed)
Abduction: Side Leg Lift (Eccentric) - Side-Lying    Lie on side. Lift top leg slightly higher than shoulder level. Keep top leg straight with body, toes pointing forward. Slowly lower for 3-5 seconds. 10___ reps per set, _1-3.  http://ecce.exer.us/63   Copyright  VHI. All rights reserved.  Hip Extension (Prone)    Lift left leg __each__  from floor, keeping knee locked. Repeat _10___ times per set. Do ___1-3_ sets per session. Do1 ____ sessions per day.   May do standing.   These should be pain free.   http://orth.exer.us/99   Copyright  VHI. All rights reserved.

## 2016-03-02 NOTE — Therapy (Signed)
[place], [place], [address] Phone: [phone]   Fax:  [phone]  Physical Therapy Treatment  Patient Details  Name: Valerie Spence MRN: [id number] Date of Birth: 1960/02/20 Referring Provider: Dr. Lynann Bologna  Encounter Date: 03/02/2016      PT End of Session - 03/02/16 1747    Visit Number 6   Number of Visits 12   Date for PT Re-Evaluation 04/07/16   PT Start Time 7106   PT Stop Time 1630   PT Time Calculation (min) 41 min   Activity Tolerance Patient tolerated treatment well   Behavior During Therapy [facility] for tasks assessed/performed      No past medical history on file.  No past surgical history on file.  There were no vitals filed for this visit.      Subjective Assessment - 03/02/16 1550    Subjective 4/10.  i have  less pain.  I have been doing alll my exercises.  Sitting more than 20 seconds.  Shooting resolved.  There has been burning 1 X within the week.   Pain Score 4    Pain Location Buttocks   Pain Orientation Left;Posterior   Pain Descriptors / Indicators Burning   Pain Type Chronic pain   Pain Radiating Towards No   Pain Frequency Intermittent   Aggravating Factors  sitting more than 20 minutes   Pain Relieving Factors standing   Effect of Pain on Daily Activities Limits sitting.             [facility] PT Assessment - 03/02/16 0001      Strength   Right Hip Extension 4+/5   Right Hip ABduction 4+/5   Left Hip Extension 4/5   Left Hip ABduction 4/5                     [facility] Adult PT Treatment/Exercise - 03/02/16 0001      Lumbar Exercises: Supine   Bent Knee Raise 10 reps   Bridge 10 reps   Isometric Hip Flexion 5 reps;5 seconds;Other (comment)   Isometric Hip Flexion Limitations to lower left hip     Lumbar Exercises: Sidelying   Clam 5 reps   Clam Limitations 4/10 pain glute med   Hip Abduction 10 reps  Left .Marland Kitchen HEP     Knee/Hip Exercises: Prone    Hamstring Curl 10 reps   Hip Extension 10 reps;Both  HEP,    Hip Extension Limitations 4/10 left proximal hamstrings,  gluteal flod 4/10     Manual Therapy   Manual therapy comments checked illiopsoas in 90-/90,  strumming,  less painful    Passive ROM LT hip IR/ ER/ flexion 5-10 X each,  sore end range all motions,  gentle                 PT Education - 03/02/16 1747    Education provided Yes   Education Details HEP   Person(s) Educated Patient   Methods Explanation;Demonstration;Verbal cues;Handout   Comprehension Verbalized understanding;Returned demonstration          PT Short Term Goals - 03/02/16 1753      PT SHORT TERM GOAL #1   Title Pt will be I with initial HEP for lumbar, core stability anf flexibility.    Baseline independent with exercises issued so far   Time 3   Period Weeks   Status On-going     PT SHORT TERM GOAL #2   Title Pt  will report ability to sit for 15 min (for commute to work) with only min pain in LE    Time 3   Period Weeks   Status Achieved     PT SHORT TERM GOAL #3   Title Pt wil complete FOTO and set goal for measurement of functional progress.    Time 1   Period Weeks   Status Unable to assess           PT Long Term Goals - 03/02/16 1754      PT LONG TERM GOAL #1   Title Pt will be able to sit as long as needed for work activity (notes), driving and pain in LE <3/10.   Baseline sitting limited to 20 minutes 5/10 pain,  driving pain 4/46   Time 8   Period Weeks   Status On-going     PT LONG TERM GOAL #2   Title Pt will be I with more advanced HEP and concepts of posture and body mechanics to prevent further injury.    Time 8   Period Weeks   Status Unable to assess     PT LONG TERM GOAL #3   Title Pt will demo LLE hamstring 90/90 test within 5 deg of each other    Time 8   Period Weeks   Status Unable to assess     PT LONG TERM GOAL #4   Title Pt will demo 4+/5 or more hip abd and ext strength to maximize  stability in gait, exercise.     Baseline hip Rt 4=/5 extension,  Abduction 4+/5,  Left hip extension 4/5,  Abduction 4/5   Time 8   Period Weeks   Status On-going     PT LONG TERM GOAL #5   Title FOTO goal to be set when avail.    Status Unable to assess               Plan - 03/02/16 1748    Clinical Impression Statement Illiopsoas less tender.  Pain now centralized.  Sitting limited to 20 minutes.  Progress toward her home exercises.  she is adherent with HEP.  MMT  Lt hip extension 4/5,  Right hip extension 4+/5,  Abduction hip: right 4+/5 ,  Lt 4/5 .No pain at end of session.   PT Next Visit Plan [facility]   PT Home Exercise Plan hamstring, piriformis and MET, prone ext , L stab 1,  back muscle energy  contract relax.  hip extension prone,  Hip abduction sidelying.   Consulted and Agree with Plan of Care Patient      Patient will benefit from skilled therapeutic intervention in order to improve the following deficits and impairments:  Increased muscle spasms, Pain, Impaired flexibility, Decreased mobility, Decreased strength, Postural dysfunction, Impaired sensation  Visit Diagnosis: Pain in left leg  Cramp and spasm  Stiffness of left hip, not elsewhere classified  Other disturbances of skin sensation     Problem List There are no active problems to display for this patient.   HARRIS,KAREN PTA 03/02/2016, 5:57 PM  [facility] [address] [place], [place], [address] Phone: [phone]   Fax:  [phone]  Name: Nishi Neiswonger MRN: [id number] Date of Birth: 1960-05-19

## 2016-03-03 ENCOUNTER — Ambulatory Visit: Payer: PRIVATE HEALTH INSURANCE | Admitting: Physical Therapy

## 2016-03-03 DIAGNOSIS — M79605 Pain in left leg: Secondary | ICD-10-CM

## 2016-03-03 DIAGNOSIS — R252 Cramp and spasm: Secondary | ICD-10-CM

## 2016-03-03 DIAGNOSIS — M25652 Stiffness of left hip, not elsewhere classified: Secondary | ICD-10-CM

## 2016-03-03 DIAGNOSIS — R208 Other disturbances of skin sensation: Secondary | ICD-10-CM

## 2016-03-03 NOTE — Patient Instructions (Signed)
Abduction: Clam (Eccentric) - Side-Lying    Lie on side with knees bent. Lift top knee, keeping feet together. Keep trunk steady. Slowly lower for 3-5 seconds. __10-20_ reps per set, _1-2__ sets per day, _5-7_ days per week.   http://ecce.exer.us/65   Copyright  VHI. All rights reserved.    Bridge [employer] small of back into mat, maintain pelvic tilt, roll up one vertebrae at a time. Focus on engaging posterior hip muscles. Hold for __1-2 __ breaths. Repeat ___10_ times.  Copyright  VHI. All rights reserved.

## 2016-03-03 NOTE — Therapy (Signed)
[place], [place], [address] Phone: [phone]   Fax:  [phone]  Physical Therapy Treatment and Discharge  Patient Details  Name: Valerie Spence MRN: [id number] Date of Birth: [birth year] Referring Provider: Dr. Lynann Bologna  Encounter Date: 03/03/2016      PT End of Session - 03/03/16 1550    Visit Number 7   Number of Visits 12   Date for PT Re-Evaluation 04/07/16   PT Start Time 1548   PT Stop Time 1628   PT Time Calculation (min) 40 min   Activity Tolerance Patient tolerated treatment well   Behavior During Therapy [facility] for tasks assessed/performed      No past medical history on file.  No past surgical history on file.  There were no vitals filed for this visit.      Subjective Assessment - 03/03/16 1549    Subjective No pain now.  I feel like I have come a long way.  Pain is more sporadic. Would like to be discharged today.    Currently in Pain? No/denies            [facility] PT Assessment - 03/03/16 1554      PROM   Overall PROM Comments 90 deg              [facility] Adult PT Treatment/Exercise - 03/03/16 1556      Self-Care   Self-Care --  final HEP      Lumbar Exercises: Stretches   Active Hamstring Stretch 2 reps;30 seconds   Piriformis Stretch 3 reps;30 seconds   Piriformis Stretch Limitations seated     Lumbar Exercises: Standing   Other Standing Lumbar Exercises standing hip abduction no UE assist   hip ext x 10 on foam pad    Other Standing Lumbar Exercises standing hip hinge for eccentric hip strengthening x 10 each side with light UE assist     Lumbar Exercises: Supine   Bridge 10 reps     Lumbar Exercises: Sidelying   Clam 10 reps   Hip Abduction 10 reps     Knee/Hip Exercises: Prone   Hip Extension Strengthening;Both                PT Education - 03/02/16 1747    Education provided Yes   Education Details HEP   Person(s) Educated Patient   Methods Explanation;Demonstration;Verbal cues;Handout   Comprehension Verbalized understanding;Returned demonstration          PT Short Term Goals - 03/03/16 1549      PT SHORT TERM GOAL #1   Title Pt will be I with initial HEP for lumbar, core stability anf flexibility.    Status Achieved     PT SHORT TERM GOAL #2   Title Pt will report ability to sit for 15 min (for commute to work) with only min pain in LE    Status Achieved     PT SHORT TERM GOAL #3   Title Pt wil complete FOTO and set goal for measurement of functional progress.    Status Deferred           PT Long Term Goals - 03/03/16 1549      PT LONG TERM GOAL #1   Title Pt will be able to sit as long as needed for work activity (notes), driving and pain in LE <3/10.   Status Achieved     PT LONG TERM GOAL #2   Title Pt will be I  with more advanced HEP and concepts of posture and body mechanics to prevent further injury.    Status Achieved     PT LONG TERM GOAL #3   Title Pt will demo LLE hamstring 90/90 test within 5 deg of each other    Status Achieved     PT LONG TERM GOAL #4   Title Pt will demo 4+/5 or more hip abd and ext strength to maximize stability in gait, exercise.     Baseline hip Rt 4=/5 extension,  Abduction 4+/5,  Left hip extension 4/5,  Abduction 4/5   Status Partially Met     PT LONG TERM GOAL #5   Title FOTO goal to be set when avail.    Status Deferred               Plan - 03/03/16 1612    Clinical Impression Statement Pt feels ready for discharge, she has no pain today and only min at proximal hamstring attachment.  Knows HEP. pelvis level.     PT Next Visit Plan NA send to [facility]    PT Home Exercise Plan hamstring, piriformis and MET, prone ext , L stab 1,  back muscle energy  contract relax.  hip extension prone,  Hip abduction sidelying.   Consulted and Agree with Plan of Care Patient      Patient will benefit from skilled therapeutic intervention in order to improve the  following deficits and impairments:     Visit Diagnosis: Pain in left leg  Cramp and spasm  Stiffness of left hip, not elsewhere classified  Other disturbances of skin sensation     Problem List There are no active problems to display for this patient.   PAA,JENNIFER 03/03/2016, 4:31 PM  [facility] [address] [place], [place], [address] Phone: [phone]   Fax:  [phone]  Name: Preslynn Bier MRN: [id number] Date of Birth: [birth year]   PHYSICAL THERAPY DISCHARGE SUMMARY  Visits from Start of Care: 7  Current functional level related to goals / functional outcomes: See above    Remaining deficits: None limiting function   Education / Equipment: HEP, posture, body mechanics  Plan: Patient agrees to discharge.  Patient goals were met. Patient is being discharged due to meeting the stated rehab goals.  ?????     Raeford Razor, PT 03/03/16 4:32 PM Phone: [phone] Fax: [phone]

## 2016-03-09 ENCOUNTER — Ambulatory Visit: Payer: PRIVATE HEALTH INSURANCE | Admitting: Physical Therapy

## 2016-03-10 ENCOUNTER — Ambulatory Visit: Payer: PRIVATE HEALTH INSURANCE | Admitting: Physical Therapy

## 2016-03-16 ENCOUNTER — Encounter: Payer: Worker's Compensation | Admitting: Physical Therapy

## 2016-03-17 ENCOUNTER — Encounter: Payer: Worker's Compensation | Admitting: Physical Therapy

## 2016-03-30 ENCOUNTER — Encounter: Payer: Worker's Compensation | Admitting: Physical Therapy

## 2016-03-31 ENCOUNTER — Encounter: Payer: Worker's Compensation | Admitting: Physical Therapy

## 2016-05-04 MED FILL — MEDROXYPROGESTERONE 5 MG TA: 5 | 90 days supply | Qty: 90 | Fill #1

## 2016-05-04 MED FILL — ESTRADIOL 2 MG TABLET: 2 | 90 days supply | Qty: 90 | Fill #4

## 2016-08-03 MED FILL — MEDROXYPROGESTERONE 5 MG TA: 5 | 90 days supply | Qty: 90 | Fill #2

## 2016-08-03 MED FILL — ESTRADIOL 2 MG TABLET: 2 | 90 days supply | Qty: 90 | Fill #0

## 2016-11-01 MED FILL — MEDROXYPROGESTERONE 5 MG TA: 5 | 90 days supply | Qty: 90 | Fill #3

## 2016-11-02 MED FILL — ESTRADIOL 2 MG TABS: 2 | 90 days supply | Qty: 90 | Fill #1

## 2016-11-19 DIAGNOSIS — Z0000 Encounter for general adult medical examination without abnormal findings: Secondary | ICD-10-CM | POA: Diagnosis not present

## 2017-02-01 MED FILL — MEDROXYPROGESTERONE 5 MG TA: 5 | 90 days supply | Qty: 90 | Fill #4

## 2017-02-01 MED FILL — ESTRADIOL 2 MG TABS: 2 | 90 days supply | Qty: 90 | Fill #0

## 2017-02-25 DIAGNOSIS — Z01419 Encounter for gynecological examination (general) (routine) without abnormal findings: Secondary | ICD-10-CM | POA: Diagnosis not present

## 2017-02-25 DIAGNOSIS — Z1231 Encounter for screening mammogram for malignant neoplasm of breast: Secondary | ICD-10-CM | POA: Diagnosis not present

## 2017-02-25 MED FILL — ESTRADIOL 1 MG TABLET: 1 | 90 days supply | Qty: 90 | Fill #0

## 2017-04-30 MED FILL — MEDROXYPROGESTERONE 5 MG TA: 5 | 90 days supply | Qty: 90 | Fill #0

## 2017-05-17 MED FILL — ESTRADIOL 1 MG TABS: 1 | 90 days supply | Qty: 90 | Fill #1

## 2017-07-26 MED FILL — MEDROXYPROGESTERONE 5 MG TA: 5 | 90 days supply | Qty: 90 | Fill #1

## 2017-08-23 MED FILL — ESTRADIOL 1 MG TABLET: 1 | 90 days supply | Qty: 90 | Fill #2

## 2017-10-01 DIAGNOSIS — Z135 Encounter for screening for eye and ear disorders: Secondary | ICD-10-CM | POA: Diagnosis not present

## 2017-10-01 DIAGNOSIS — H5203 Hypermetropia, bilateral: Secondary | ICD-10-CM | POA: Diagnosis not present

## 2017-10-14 MED FILL — PEG-3350 SOLUTION: 420 | 1 days supply | Qty: 4000 | Fill #0

## 2017-10-25 MED FILL — MEDROXYPROGESTERONE 5 MG TA: 5 | 90 days supply | Qty: 90 | Fill #2

## 2017-11-01 DIAGNOSIS — K6289 Other specified diseases of anus and rectum: Secondary | ICD-10-CM | POA: Diagnosis not present

## 2017-11-01 DIAGNOSIS — K635 Polyp of colon: Secondary | ICD-10-CM | POA: Diagnosis not present

## 2017-11-01 DIAGNOSIS — D126 Benign neoplasm of colon, unspecified: Secondary | ICD-10-CM | POA: Diagnosis not present

## 2017-11-01 DIAGNOSIS — Z1211 Encounter for screening for malignant neoplasm of colon: Secondary | ICD-10-CM | POA: Diagnosis not present

## 2017-11-24 MED FILL — ESTRADIOL 1 MG TABLET: 1 | 90 days supply | Qty: 90 | Fill #3

## 2017-12-01 DIAGNOSIS — Z0000 Encounter for general adult medical examination without abnormal findings: Secondary | ICD-10-CM | POA: Diagnosis not present

## 2017-12-01 DIAGNOSIS — Z13220 Encounter for screening for lipoid disorders: Secondary | ICD-10-CM | POA: Diagnosis not present

## 2018-01-07 IMAGING — MR MR LUMBAR SPINE W/O CM
5 series · 48 of 48 positions shown · non-contrast
Comparison: CT scan of the abdomen and pelvis dated 10/16/2010

CLINICAL DATA: Low back and left buttock pain. Lumbar
radiculopathy.

EXAM:
MRI LUMBAR SPINE WITHOUT CONTRAST
TECHNIQUE: Multiplanar, multisequence MR imaging of the lumbar spine was
performed. No intravenous contrast was administered.

[Series 2: T1 · sagittal · 4.0mm · 0.81mm/px · 7 of 14 slices shown (1 of 2)]
[im 1/14]
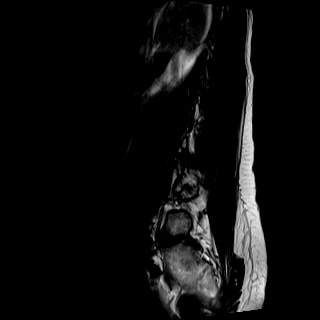
[im 3/14]
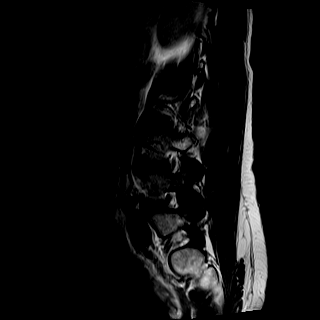
[im 5/14]
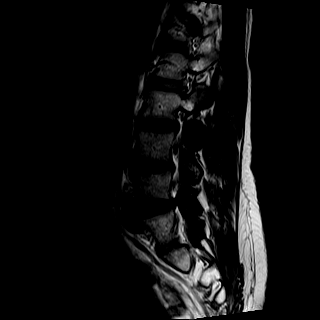
[im 7/14]
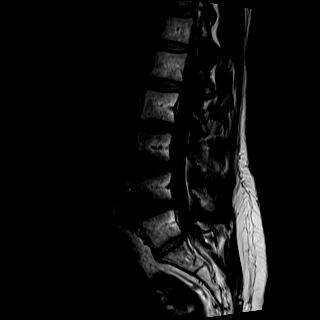
[im 9/14]
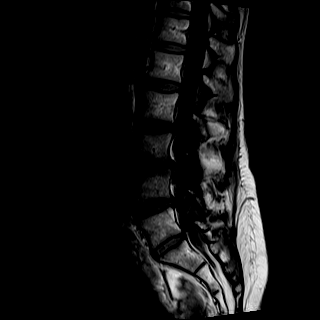
[im 11/14]
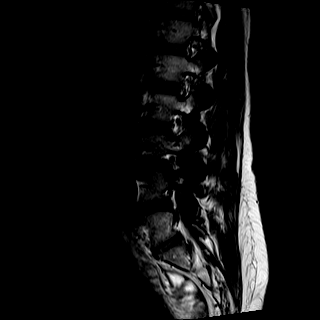
[im 14/14]
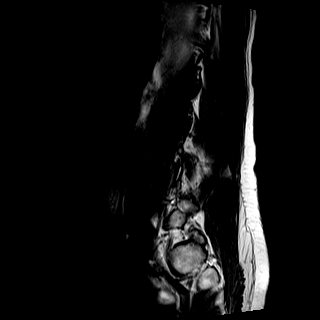

[Series 3: T2 · sagittal · 4.0mm · 0.81mm/px · 7 of 14 slices shown (1 of 2)]
[im 1/14]
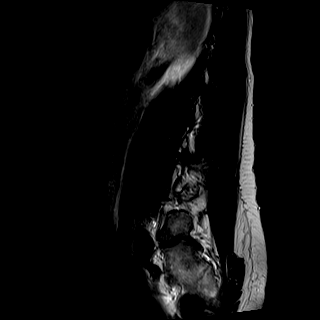
[im 3/14]
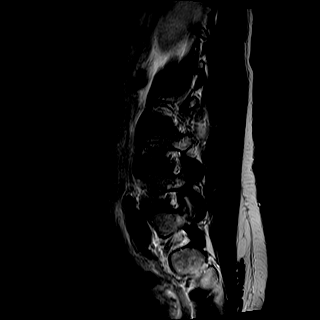
[im 5/14]
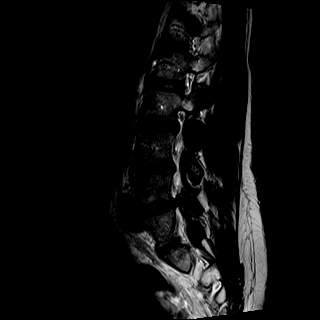
[im 7/14]
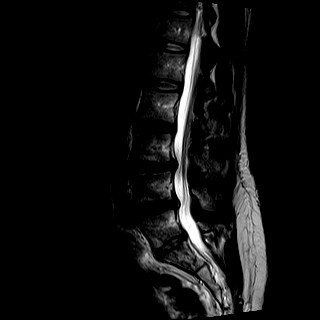
[im 9/14]
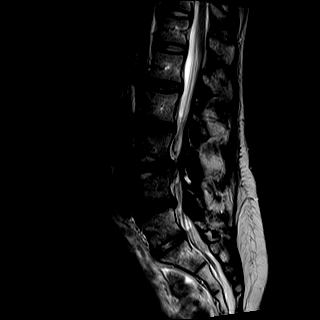
[im 11/14]
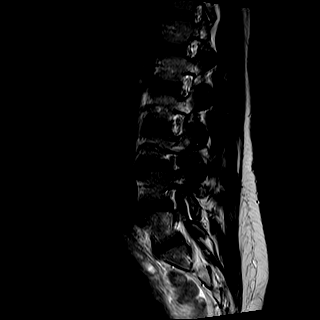
[im 14/14]
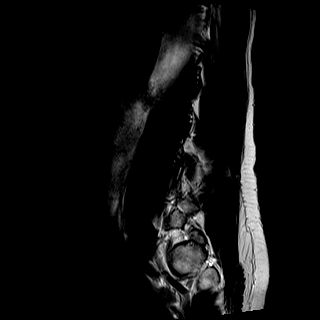

[Series 4: STIR · sagittal · 4.0mm · 0.81mm/px · 6 of 14 slices shown]
[im 1/14]
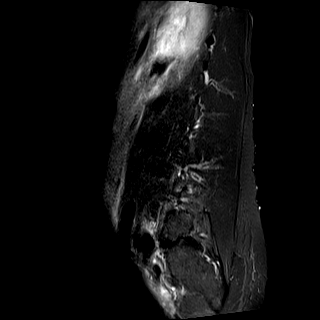
[im 3/14]
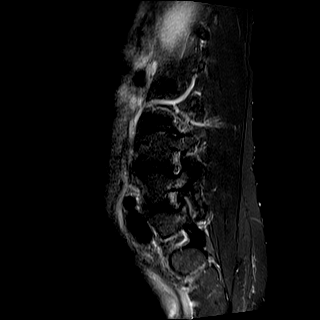
[im 6/14]
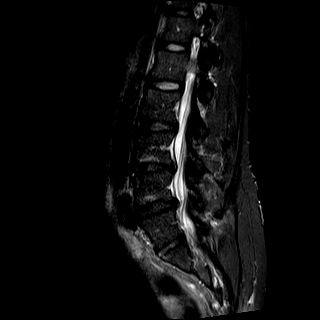
[im 8/14]
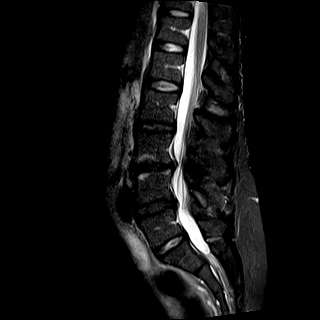
[im 11/14]
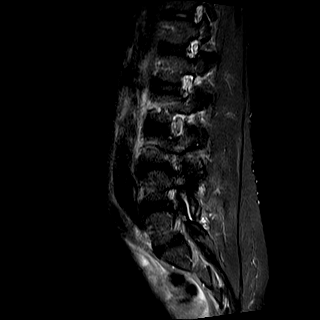
[im 14/14]
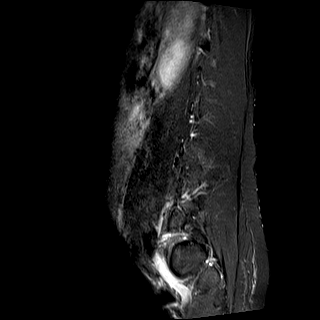

[Series 5: T2 · axial · 4.0mm · 0.62mm/px · z∈[-90,+90]mm · 14 of 32 slices shown (2 of 2)]
[im 1/32]
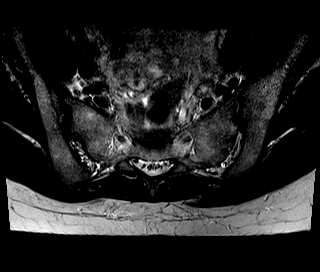
[im 3/32]
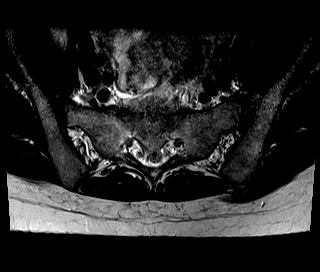
[im 5/32]
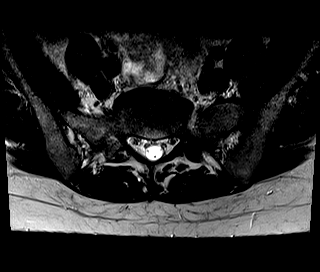
[im 8/32]
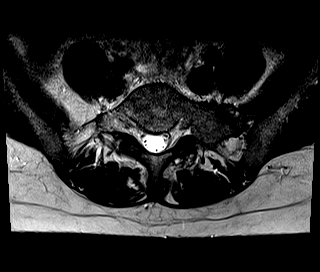
[im 10/32]
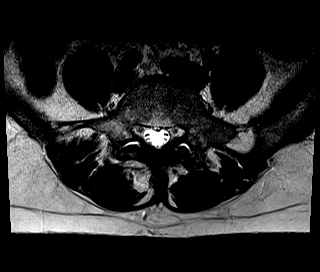
[im 12/32]
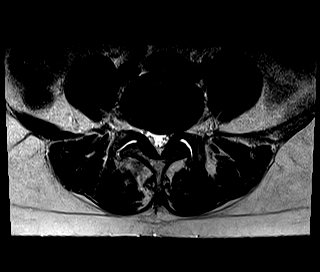
[im 15/32]
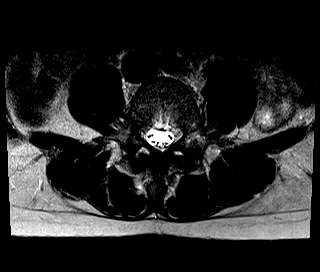
[im 17/32]
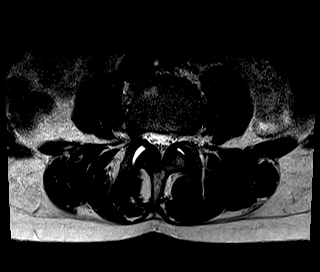
[im 20/32]
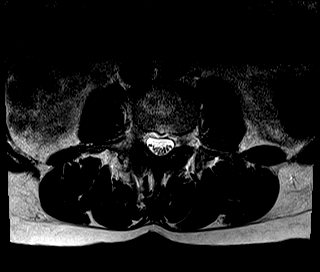
[im 22/32]
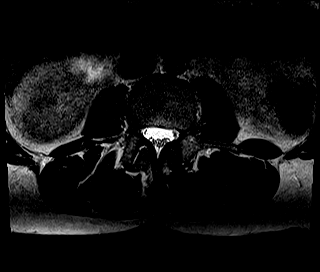
[im 24/32]
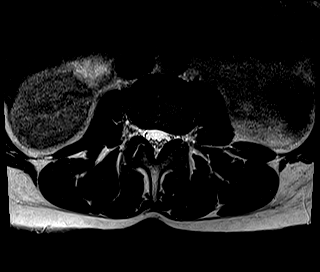
[im 27/32]
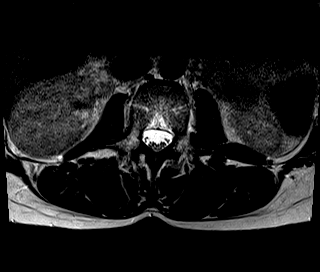
[im 29/32]
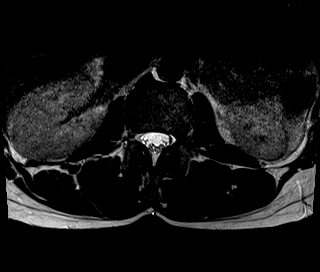
[im 32/32]
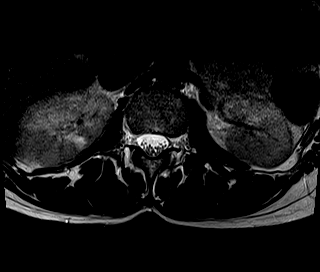

[Series 6: T1 · axial · 4.0mm · 0.62mm/px · z∈[-90,+90]mm · 14 of 32 slices shown (2 of 2)]
[im 1/32]
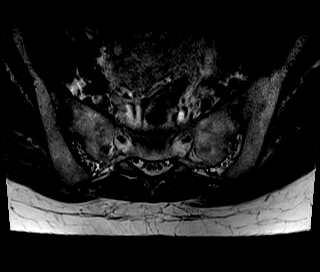
[im 3/32]
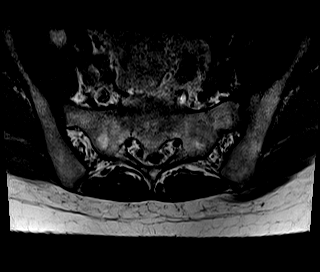
[im 5/32]
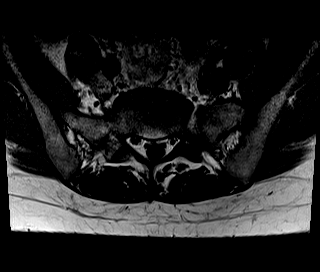
[im 8/32]
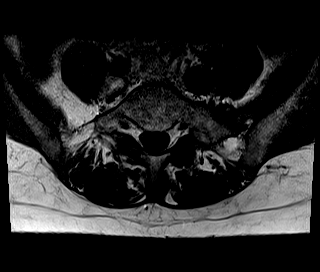
[im 10/32]
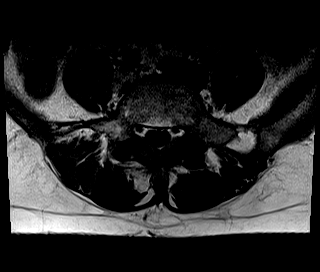
[im 12/32]
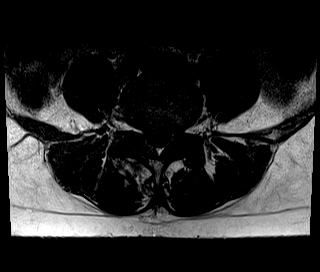
[im 15/32]
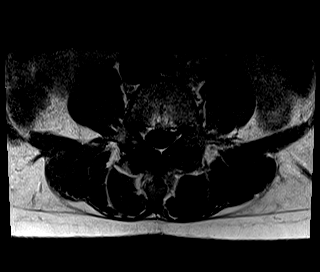
[im 17/32]
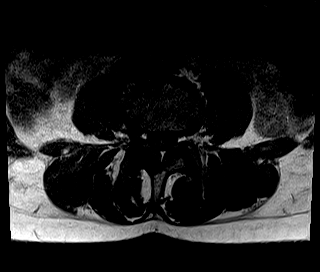
[im 20/32]
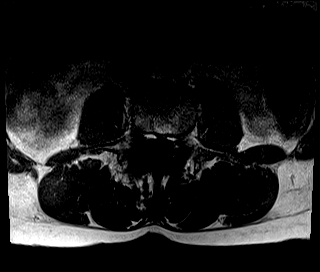
[im 22/32]
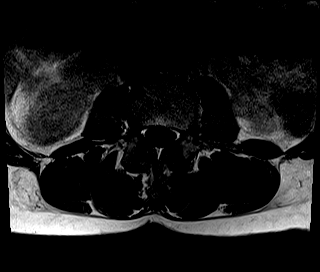
[im 24/32]
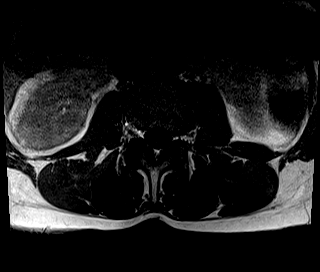
[im 27/32]
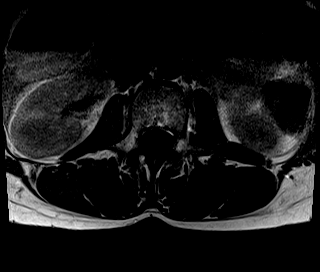
[im 29/32]
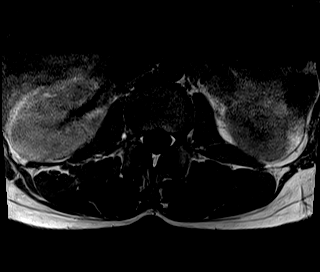
[im 32/32]
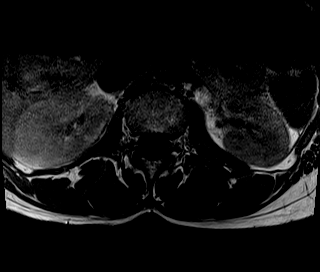

[48 of 48 positions shown; findings below may reference images not displayed]

FINDINGS: Segmentation: Standard. Prominent left transverse process of L5
articulates with the sacrum.

Alignment:  Physiologic.

Vertebrae:  No fracture, evidence of discitis, or bone lesion.

Conus medullaris: Extends to the L1-2 level and appears normal.

Paraspinal and other soft tissues: Negative.

Disc levels:

T11-12 through L1-2:  Normal.

L2-3: Disc desiccation the small disc bulge in and lateral to the
left neural foramen without neural impingement. Slight hypertrophy
of the ligamentum flavum.

L3-4: Broad-based subligamentous disc protrusion asymmetric to the
right slightly compressing the thecal sac in the right lateral
recess. Hypertrophy of the ligamentum flavum and facet joints with
bilateral joint effusions, right greater than left contribute to the
right lateral recess compression.

L4-5: Broad-based soft disc protrusion asymmetric to the left
compressing the thecal sac particularly in the left lateral recess.
Hypertrophy of the ligamentum flavum and facet joints and bilateral
joint effusions contribute to the compression of the left lateral
recess which should affect the left L5 nerve, best seen on image 21
of series 5.

L5-S1: Normal disc. Slight degenerative changes at the articulation
of the left transverse process of L5 with S1.
IMPRESSION: 1. Soft disc protrusion at L4-5 asymmetric to the left with left
lateral recess stenosis which could affect the left L5 nerve.
2. Arthritic type changes at the articulation of the left transverse
process of L5 with S1.
3. Soft disc protrusion at L3-4 asymmetric to the right with some
compression of the right lateral recess.

## 2018-03-22 DIAGNOSIS — Z1231 Encounter for screening mammogram for malignant neoplasm of breast: Secondary | ICD-10-CM | POA: Diagnosis not present

## 2018-03-22 DIAGNOSIS — Z01419 Encounter for gynecological examination (general) (routine) without abnormal findings: Secondary | ICD-10-CM | POA: Diagnosis not present

## 2018-06-17 DIAGNOSIS — Z03818 Encounter for observation for suspected exposure to other biological agents ruled out: Secondary | ICD-10-CM | POA: Diagnosis not present

## 2018-10-03 DIAGNOSIS — H5203 Hypermetropia, bilateral: Secondary | ICD-10-CM | POA: Diagnosis not present

## 2018-10-03 DIAGNOSIS — Z135 Encounter for screening for eye and ear disorders: Secondary | ICD-10-CM | POA: Diagnosis not present

## 2018-10-03 DIAGNOSIS — H524 Presbyopia: Secondary | ICD-10-CM | POA: Diagnosis not present

## 2018-12-07 DIAGNOSIS — Z0000 Encounter for general adult medical examination without abnormal findings: Secondary | ICD-10-CM | POA: Diagnosis not present

## 2018-12-07 DIAGNOSIS — Z130 Encounter for screening for diseases of the blood and blood-forming organs and certain disorders involving the immune mechanism: Secondary | ICD-10-CM | POA: Diagnosis not present

## 2018-12-07 DIAGNOSIS — R7303 Prediabetes: Secondary | ICD-10-CM | POA: Diagnosis not present

## 2018-12-07 DIAGNOSIS — Z23 Encounter for immunization: Secondary | ICD-10-CM | POA: Diagnosis not present

## 2018-12-26 ENCOUNTER — Ambulatory Visit (INDEPENDENT_AMBULATORY_CARE_PROVIDER_SITE_OTHER): Payer: 59 | Admitting: Podiatry

## 2018-12-26 ENCOUNTER — Ambulatory Visit (INDEPENDENT_AMBULATORY_CARE_PROVIDER_SITE_OTHER): Payer: 59

## 2018-12-26 ENCOUNTER — Other Ambulatory Visit: Payer: Self-pay

## 2018-12-26 ENCOUNTER — Encounter: Payer: Self-pay | Admitting: Podiatry

## 2018-12-26 VITALS — BP 99/58 | HR 56 | Temp 97.9°F | Resp 16

## 2018-12-26 DIAGNOSIS — M2011 Hallux valgus (acquired), right foot: Secondary | ICD-10-CM | POA: Diagnosis not present

## 2018-12-26 DIAGNOSIS — M2041 Other hammer toe(s) (acquired), right foot: Secondary | ICD-10-CM | POA: Diagnosis not present

## 2018-12-26 DIAGNOSIS — M2012 Hallux valgus (acquired), left foot: Secondary | ICD-10-CM

## 2018-12-26 DIAGNOSIS — M2042 Other hammer toe(s) (acquired), left foot: Secondary | ICD-10-CM

## 2018-12-26 DIAGNOSIS — N951 Menopausal and female climacteric states: Secondary | ICD-10-CM | POA: Insufficient documentation

## 2018-12-26 NOTE — Patient Instructions (Signed)
Bunion  A bunion is a bump on the base of the big toe that forms when the bones of the big toe joint move out of position. Bunions may be small at first, but they often get larger over time. They can make walking painful. What are the causes? A bunion may be caused by:  Wearing narrow or pointed shoes that force the big toe to press against the other toes.  Abnormal foot development that causes the foot to roll inward (pronate).  Changes in the foot that are caused by certain diseases, such as rheumatoid arthritis or polio.  A foot injury. What increases the risk? The following factors may make you more likely to develop this condition:  Wearing shoes that squeeze the toes together.  Having certain diseases, such as: ? Rheumatoid arthritis. ? Polio. ? Cerebral palsy.  Having family members who have bunions.  Being born with a foot deformity, such as flat feet or low arches.  Doing activities that put a lot of pressure on the feet, such as ballet dancing. What are the signs or symptoms? The main symptom of a bunion is a noticeable bump on the big toe. Other symptoms may include:  Pain.  Swelling around the big toe.  Redness and inflammation.  Thick or hardened skin on the big toe or between the toes.  Stiffness or loss of motion in the big toe.  Trouble with walking. How is this diagnosed? A bunion may be diagnosed based on your symptoms, medical history, and activities. You may have tests, such as:  X-rays. These allow your health care provider to check the position of the bones in your foot and look for damage to your joint. They also help your health care provider determine the severity of your bunion and the best way to treat it.  Joint aspiration. In this test, a sample of fluid is removed from the toe joint. This test may be done if you are in a lot of pain. It helps rule out diseases that cause painful swelling of the joints, such as arthritis. How is this  treated? Treatment depends on the severity of your symptoms. The goal of treatment is to relieve symptoms and prevent the bunion from getting worse. Your health care provider may recommend:  Wearing shoes that have a wide toe box.  Using bunion pads to cushion the affected area.  Taping your toes together to keep them in a normal position.  Placing a device inside your shoe (orthotics) to help reduce pressure on your toe joint.  Taking medicine to ease pain, inflammation, and swelling.  Applying heat or ice to the affected area.  Doing stretching exercises.  Surgery to remove scar tissue and move the toes back into their normal position. This treatment is rare. Follow these instructions at home: Managing pain, stiffness, and swelling   If directed, put ice on the painful area: ? Put ice in a plastic bag. ? Place a towel between your skin and the bag. ? Leave the ice on for 20 minutes, 2-3 times a day. Activity   If directed, apply heat to the affected area before you exercise. Use the heat source that your health care provider recommends, such as a moist heat pack or a heating pad. ? Place a towel between your skin and the heat source. ? Leave the heat on for 20-30 minutes. ? Remove the heat if your skin turns bright red. This is especially important if you are unable to feel pain,   heat, or cold. You may have a greater risk of getting burned.  Do exercises as told by your health care provider. General instructions  Support your toe joint with proper footwear, shoe padding, or taping as told by your health care provider.  Take over-the-counter and prescription medicines only as told by your health care provider.  Keep all follow-up visits as told by your health care provider. This is important. Contact a health care provider if your symptoms:  Get worse.  Do not improve in 2 weeks. Get help right away if you have:  Severe pain and trouble with walking. Summary  A  bunion is a bump on the base of the big toe that forms when the bones of the big toe joint move out of position.  Bunions can make walking painful.  Treatment depends on the severity of your symptoms.  Support your toe joint with proper footwear, shoe padding, or taping as told by your health care provider. This information is not intended to replace advice given to you by your health care provider. Make sure you discuss any questions you have with your health care provider. Document Released: 12/29/2004 Document Revised: 07/05/2017 Document Reviewed: 05/11/2017 Elsevier Patient Education  Hurley Toe  Hammer toe is a change in the shape (a deformity) of your toe. The deformity causes the middle joint of your toe to stay bent. This causes pain, especially when you are wearing shoes. Hammer toe starts gradually. At first, the toe can be straightened. Gradually over time, the deformity becomes stiff and permanent. Early treatments to keep the toe straight may relieve pain. As the deformity becomes stiff and permanent, surgery may be needed to straighten the toe. What are the causes? Hammer toe is caused by abnormal bending of the toe joint that is closest to your foot. It happens gradually over time. This pulls on the muscles and connections (tendons) of the toe joint, making them weak and stiff. It is often related to wearing shoes that are too short or narrow and do not let your toes straighten. What increases the risk? You may be at greater risk for hammer toe if you:  Are female.  Are older.  Wear shoes that are too small.  Wear high-heeled shoes that pinch your toes.  Are a Engineer, mining.  Have a second toe that is longer than your big toe (first toe).  Injure your foot or toe.  Have arthritis.  Have a family history of hammer toe.  Have a nerve or muscle disorder. What are the signs or symptoms? The main symptoms of this condition are pain and  deformity of the toe. The pain is worse when wearing shoes, walking, or running. Other symptoms may include:  Corns or calluses over the bent part of the toe or between the toes.  Redness and a burning feeling on the toe.  An open sore that forms on the top of the toe.  Not being able to straighten the toe. How is this diagnosed? This condition is diagnosed based on your symptoms and a physical exam. During the exam, your health care provider will try to straighten your toe to see how stiff the deformity is. You may also have tests, such as:  A blood test to check for rheumatoid arthritis.  An X-ray to show how severe the deformity is. How is this treated? Treatment for this condition will depend on how stiff the deformity is. Surgery is often needed. However,  sometimes a hammer toe can be straightened without surgery. Treatments that do not involve surgery include:  Taping the toe into a straightened position.  Using pads and cushions to protect the toe (orthotics).  Wearing shoes that provide enough room for the toes.  Doing toe-stretching exercises at home.  Taking an NSAID to reduce pain and swelling. If these treatments do not help or the toe cannot be straightened, surgery is the next option. The most common surgeries used to straighten a hammer toe include:  Arthroplasty. In this procedure, part of the joint is removed, and that allows the toe to straighten.  Fusion. In this procedure, cartilage between the two bones of the joint is taken out and the bones are fused together into one longer bone.  Implantation. In this procedure, part of the bone is removed and replaced with an implant to let the toe move again.  Flexor tendon transfer. In this procedure, the tendons that curl the toes down (flexor tendons) are repositioned. Follow these instructions at home:  Take over-the-counter and prescription medicines only as told by your health care provider.  Do toe  straightening and stretching exercises as told by your health care provider.  Keep all follow-up visits as told by your health care provider. This is important. How is this prevented?  Wear shoes that give your toes enough room and do not cause pain.  Do not wear high-heeled shoes. Contact a health care provider if:  Your pain gets worse.  Your toe becomes red or swollen.  You develop an open sore on your toe. This information is not intended to replace advice given to you by your health care provider. Make sure you discuss any questions you have with your health care provider. Document Released: 12/27/1999 Document Revised: 12/11/2016 Document Reviewed: 04/24/2015 Elsevier Patient Education  2020 [employer].

## 2018-12-26 NOTE — Progress Notes (Signed)
Subjective:   Patient ID: Valerie Spence, female   DOB: 58 y.o.   MRN: [id number]   HPI Patient presents stating she had bunion correction done years ago and she is developed corns on the medial side of the first metatarsal head of both feet right worse than left and states they get tender and make shoe gear difficult.  Does not remember specific injury and states that it is been gradually getting worse over the last few years and she is not sure what we may be able to do to help her.  Patient does not smoke likes to be active   Review of Systems  All other systems reviewed and are negative.       Objective:  Physical Exam Vitals and nursing note reviewed.  Constitutional:      Appearance: She is well-developed.  Pulmonary:     Effort: Pulmonary effort is normal.  Musculoskeletal:        General: Normal range of motion.  Skin:    General: Skin is warm.  Neurological:     Mental Status: She is alert.     Neurovascular status intact muscle strength found to be adequate range of motion within normal limits with patient found to have keratotic lesions on the medial side first metatarsal right over left with deformity and deviation of the right over left foot.  Patient is found to have good digital perfusion well oriented x3 with moderate depression of the arch noted bilateral and has lesions also on several digits     Assessment:  Inadequate correction from bunion surgery done years ago with structural abnormalities right over left with keratotic lesion formation     Plan:  H&P reviewed conditions and sterile sharp debridement of lesions accomplished today along with wider shoe gear.  I discussed considerations for correction of structural deformity hammertoe repair depending on the response to aggressive debridement technique and I reviewed all those factors with her  X-rays indicates she does have structural deformity and elevation of the intermetatarsal angle bilateral with  previous bunion surgery that was done with hammertoe deformity noted bilateral

## 2018-12-26 NOTE — Progress Notes (Signed)
   Subjective:    Patient ID: Valerie Spence, female    DOB: Jun 08, 1960, 58 y.o.   MRN: [id number]  HPI    Review of Systems  All other systems reviewed and are negative.      Objective:   Physical Exam        Assessment & Plan:

## 2019-03-22 DIAGNOSIS — Z23 Encounter for immunization: Secondary | ICD-10-CM | POA: Diagnosis not present

## 2019-04-27 DIAGNOSIS — Z124 Encounter for screening for malignant neoplasm of cervix: Secondary | ICD-10-CM | POA: Diagnosis not present

## 2019-04-27 DIAGNOSIS — Z01419 Encounter for gynecological examination (general) (routine) without abnormal findings: Secondary | ICD-10-CM | POA: Diagnosis not present

## 2019-04-27 DIAGNOSIS — Z1231 Encounter for screening mammogram for malignant neoplasm of breast: Secondary | ICD-10-CM | POA: Diagnosis not present

## 2019-06-05 DIAGNOSIS — R7309 Other abnormal glucose: Secondary | ICD-10-CM | POA: Diagnosis not present

## 2019-06-20 ENCOUNTER — Ambulatory Visit: Payer: 59 | Admitting: Skilled Nursing Facility1

## 2019-06-22 ENCOUNTER — Other Ambulatory Visit: Payer: Self-pay

## 2019-06-22 ENCOUNTER — Encounter: Payer: Self-pay | Admitting: Registered"

## 2019-06-22 ENCOUNTER — Encounter: Payer: 59 | Attending: Family Medicine | Admitting: Registered"

## 2019-06-22 DIAGNOSIS — Z713 Dietary counseling and surveillance: Secondary | ICD-10-CM | POA: Insufficient documentation

## 2019-06-22 DIAGNOSIS — R7303 Prediabetes: Secondary | ICD-10-CM | POA: Diagnosis not present

## 2019-06-22 NOTE — Progress Notes (Signed)
  Medical Nutrition Therapy: Appt start time: 3:31 end time:  4:18.   Assessment:  Primary concerns today: per referral, elevated A1c (6.2) and increase from 6.1 (6 months prior).   Pt expectations: wants to focus on carbohydrates, reading food labels  States she recently changed diet to vegan July 2021. States she increased her carbohydrate intake as a result due to lacking protein intake. States she has been snacking more. States daughter is vegan and told her about veganism. Reports daughter is very influential. Reports daughter is living at home and cooking most meals for family. Pt states she is simple when it comes to eating. Reports she also watched [facility] [employer] [facility] and changed eating regimen due to help with animal cruelty.   States she likes to eat 1/2 pack crackers (24g CHO), unsalted premium crackers. States she typically eats tofu, meatless proteins, lentils, beans as protein options.   States is not hungry at during the day but gets hungry when arriving home. States breakfast has not always something she eats. Reports not being a "big breakfast" eater.   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 Reports she is physically active and loves it.    Preferred Learning Style:   No preference indicated   Learning Readiness:   Ready  Change in progress   MEDICATIONS: See list   DIETARY INTAKE:  Usual eating pattern includes 2 meals and 1-3 snacks per day.  Everyday foods include salad, soup, crackers.  Avoided foods include meat, dairy, eggs, ice cream, and soda.    24-hr recall:  B (7:15 AM): coffee (2 tsp almond creamer)  Snk ( AM):   L ( PM): salad (vegetables, cranberries, balsamic dressing) Snk ( PM):  D ( PM): 2 c broccoli soup + 1/2 sleeve crackers Snk ( PM): 10 peanuts Beverages: coffee (2 tsp almond creamer), water, herbal hot tea  Usual physical activity: Total toning, Zumba, restorative yoga, or walking 45-60 min, 7 days/week  Estimated energy needs: 2000-2200  calories 225-248 g carbohydrates 150-165 g protein 56-61 g fat  Progress Towards Goal(s):  In progress.   Nutritional Diagnosis:  NB-1.1 Food and nutrition-related knowledge deficit As related to prediabetes.  As evidenced by dietary recall of unbalanced meals/snacks.    Intervention:  Pt was educated and counseled on role of fiber in eating regimen and importance of physical activity. Discussed meal planning and how to balance meals/snacks using MyPlate for prediabetes. Pt states she will work on adding breakfast into daily regimen and trying to increase intake.   Teaching Method Utilized:  Visual Auditory Hands on  Handouts given during visit include:  My Plate for prediabetes  Barriers to learning/adherence to lifestyle change: contemplative stage of change  Demonstrated degree of understanding via:  Teach Back   Monitoring/Evaluation:  Dietary intake, exercise, and body weight prn.

## 2019-11-13 DIAGNOSIS — H5203 Hypermetropia, bilateral: Secondary | ICD-10-CM | POA: Diagnosis not present

## 2019-11-13 DIAGNOSIS — Z135 Encounter for screening for eye and ear disorders: Secondary | ICD-10-CM | POA: Diagnosis not present

## 2019-11-13 DIAGNOSIS — H524 Presbyopia: Secondary | ICD-10-CM | POA: Diagnosis not present

## 2019-12-11 ENCOUNTER — Ambulatory Visit (INDEPENDENT_AMBULATORY_CARE_PROVIDER_SITE_OTHER): Payer: 59

## 2019-12-11 ENCOUNTER — Other Ambulatory Visit: Payer: Self-pay

## 2019-12-11 ENCOUNTER — Encounter: Payer: Self-pay | Admitting: Podiatry

## 2019-12-11 ENCOUNTER — Ambulatory Visit (INDEPENDENT_AMBULATORY_CARE_PROVIDER_SITE_OTHER): Payer: 59 | Admitting: Podiatry

## 2019-12-11 DIAGNOSIS — D169 Benign neoplasm of bone and articular cartilage, unspecified: Secondary | ICD-10-CM | POA: Diagnosis not present

## 2019-12-11 DIAGNOSIS — M2042 Other hammer toe(s) (acquired), left foot: Secondary | ICD-10-CM | POA: Diagnosis not present

## 2019-12-11 DIAGNOSIS — M2012 Hallux valgus (acquired), left foot: Secondary | ICD-10-CM | POA: Diagnosis not present

## 2019-12-11 DIAGNOSIS — M2041 Other hammer toe(s) (acquired), right foot: Secondary | ICD-10-CM

## 2019-12-11 DIAGNOSIS — M2011 Hallux valgus (acquired), right foot: Secondary | ICD-10-CM | POA: Diagnosis not present

## 2019-12-12 NOTE — Progress Notes (Signed)
Subjective:   Patient ID: Valerie Spence, female   DOB: 59 y.o.   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  States also she is got a spur on the inside of the right first metatarsal head and on the right big toe that can be bothersome and make shoe gear difficult   ROS      Objective:  Physical Exam  Neurovascular status intact with patient noted to have good structural correction of previous deformity with distal deformity digits 2 3 right digit 4 left and also spur on the right big toe and the right first metatarsal     Assessment:  Chronic foot structural problems secondary to moderate compensatory changes from previous surgery and distal contracture along with spur formation     Plan:  H&P reviewed all conditions with Valerie Spence educating her on this.  Discussed different possibilities and I have recommended distal arthroplasty and removal of bone spur and she would like to do this but has to wait till next year.  Today debridement accomplished educated her on surgery and she will reappoint January to discuss in greater detail  X-rays indicate moderate structural distal deformity right and left foot with what appears to be spur around the right big toe

## 2019-12-20 DIAGNOSIS — Z0000 Encounter for general adult medical examination without abnormal findings: Secondary | ICD-10-CM | POA: Diagnosis not present

## 2019-12-20 DIAGNOSIS — Z13220 Encounter for screening for lipoid disorders: Secondary | ICD-10-CM | POA: Diagnosis not present

## 2019-12-20 DIAGNOSIS — R7303 Prediabetes: Secondary | ICD-10-CM | POA: Diagnosis not present

## 2019-12-20 DIAGNOSIS — Z131 Encounter for screening for diabetes mellitus: Secondary | ICD-10-CM | POA: Diagnosis not present

## 2019-12-20 DIAGNOSIS — Z130 Encounter for screening for diseases of the blood and blood-forming organs and certain disorders involving the immune mechanism: Secondary | ICD-10-CM | POA: Diagnosis not present

## 2020-01-23 DIAGNOSIS — M79676 Pain in unspecified toe(s): Secondary | ICD-10-CM

## 2020-01-29 ENCOUNTER — Ambulatory Visit: Payer: 59 | Admitting: Podiatry

## 2020-01-31 ENCOUNTER — Ambulatory Visit (INDEPENDENT_AMBULATORY_CARE_PROVIDER_SITE_OTHER): Payer: 59 | Admitting: Podiatry

## 2020-01-31 ENCOUNTER — Other Ambulatory Visit: Payer: Self-pay

## 2020-01-31 ENCOUNTER — Encounter: Payer: Self-pay | Admitting: Podiatry

## 2020-01-31 DIAGNOSIS — D169 Benign neoplasm of bone and articular cartilage, unspecified: Secondary | ICD-10-CM

## 2020-01-31 DIAGNOSIS — M2012 Hallux valgus (acquired), left foot: Secondary | ICD-10-CM | POA: Diagnosis not present

## 2020-01-31 DIAGNOSIS — M2041 Other hammer toe(s) (acquired), right foot: Secondary | ICD-10-CM | POA: Diagnosis not present

## 2020-01-31 DIAGNOSIS — M2011 Hallux valgus (acquired), right foot: Secondary | ICD-10-CM | POA: Diagnosis not present

## 2020-01-31 DIAGNOSIS — M2042 Other hammer toe(s) (acquired), left foot: Secondary | ICD-10-CM | POA: Diagnosis not present

## 2020-02-01 NOTE — Progress Notes (Signed)
Subjective:   Patient ID: Valerie Spence, female   DOB: 60 y.o.   MRN: [id number]   HPI Patient presents stating she is excited to get her feet fixed as they get sore and she cannot wear shoe gear.  States she has tried padding she is tried shoe gear modifications and trimming without relief of symptoms.   ROS      Objective:  Physical Exam  Neurovascular status found to be intact with patient found to have distal deformity digits 2 3 right 4 left after previous proximal fusion along with a prominence around the first metatarsal head right and on the right inner phalangeal joint.  Left also shows irritation of the second toe with previous arthroplasty that had been done     Assessment:  Patient is developed significant structural deformity of both feet after having had foot surgery years ago     Plan:  H&P reviewed all conditions and x-rays with patient.  At this point I recommended distal arthroplasty 2 3 right for left removal of the prominence of the first metatarsal head right and the inner phalangeal joint right hallux and redoing the second digit left with pin fixation.  I did explain this is revisional surgery and there is [place] no long-term guarantees as to what we will be able to accomplish but I do think this is the best chance we have at correction.  I spent a great deal of time going over this with the patient and I allowed her to sign consent form and she does understand to complete total recovery can take upwards of 6 months and there is no long-term guarantees

## 2020-02-19 ENCOUNTER — Telehealth: Payer: Self-pay

## 2020-02-19 NOTE — Telephone Encounter (Signed)
DOS 02/27/2020  KELLER/MCBRIDE BUNIONECTOMY RT - 77034 HAMMERTOE REPAIR 2,3 RT - 2,4 LT - 28285 EXOSTECTOMY 1ST RT - 28108  [facility] EFFECTIVE DATE - 01/13/2020  PLAN DEDUCTIBLE - $0.00 OUT OF POCKET - $4000.00 W/ $0352.48 REMAINING COPAY $0.00 COINSURANCE - 60%  SPOKE TO JERRY AT [facility] STATED NO PRECERT REQUIRED FOR CPT [id number], [id number] OR 18590. CALL REF # [id number]

## 2020-02-26 ENCOUNTER — Other Ambulatory Visit: Payer: Self-pay | Admitting: Podiatry

## 2020-02-26 MED ORDER — HYDROCODONE-ACETAMINOPHEN 10-325 MG PO TABS: 1.0000 | ORAL_TABLET | Freq: Four times a day (QID) | ORAL | 0 refills | Status: DC | PRN

## 2020-02-26 MED FILL — HYDROCODON-APAP 10-325: 10-325 | 5 days supply | Qty: 20 | Fill #0

## 2020-02-26 NOTE — Addendum Note (Signed)
Addended by: Wallene Huh on: 02/26/2020 02:24 PM   Modules accepted: Orders

## 2020-02-27 ENCOUNTER — Encounter: Payer: Self-pay | Admitting: Podiatry

## 2020-02-27 DIAGNOSIS — M898X7 Other specified disorders of bone, ankle and foot: Secondary | ICD-10-CM | POA: Diagnosis not present

## 2020-02-27 DIAGNOSIS — M25775 Osteophyte, left foot: Secondary | ICD-10-CM | POA: Diagnosis not present

## 2020-02-27 DIAGNOSIS — M25771 Osteophyte, right ankle: Secondary | ICD-10-CM | POA: Diagnosis not present

## 2020-02-27 DIAGNOSIS — M2011 Hallux valgus (acquired), right foot: Secondary | ICD-10-CM | POA: Diagnosis not present

## 2020-02-27 DIAGNOSIS — M2041 Other hammer toe(s) (acquired), right foot: Secondary | ICD-10-CM | POA: Diagnosis not present

## 2020-02-27 DIAGNOSIS — M2012 Hallux valgus (acquired), left foot: Secondary | ICD-10-CM | POA: Diagnosis not present

## 2020-02-27 DIAGNOSIS — M25774 Osteophyte, right foot: Secondary | ICD-10-CM | POA: Diagnosis not present

## 2020-02-27 DIAGNOSIS — M2042 Other hammer toe(s) (acquired), left foot: Secondary | ICD-10-CM | POA: Diagnosis not present

## 2020-03-04 ENCOUNTER — Ambulatory Visit (INDEPENDENT_AMBULATORY_CARE_PROVIDER_SITE_OTHER): Payer: 59 | Admitting: Podiatry

## 2020-03-04 ENCOUNTER — Encounter: Payer: Self-pay | Admitting: Podiatry

## 2020-03-04 ENCOUNTER — Other Ambulatory Visit: Payer: Self-pay

## 2020-03-04 ENCOUNTER — Ambulatory Visit (INDEPENDENT_AMBULATORY_CARE_PROVIDER_SITE_OTHER): Payer: 59

## 2020-03-04 DIAGNOSIS — M2042 Other hammer toe(s) (acquired), left foot: Secondary | ICD-10-CM

## 2020-03-04 DIAGNOSIS — M2041 Other hammer toe(s) (acquired), right foot: Secondary | ICD-10-CM

## 2020-03-05 NOTE — Progress Notes (Signed)
Subjective:   Patient ID: Valerie Spence, female   DOB: 60 y.o.   MRN: [id number]   HPI Patient states doing great with surgery very pleased   ROS      Objective:  Physical Exam  Neurovascular status intact negative Bevelyn Buckles' sign noted wound edges bilateral well healed and stitches are intact good alignment noted     Assessment:  Doing well post digital procedures bilateral     Plan:  H&P reviewed condition sterile dressings reapplied continue elevation compression immobilization gradual increase in activities and reappoint to recheck again in 2 weeks or earlier if needed  X-rays indicated satisfactory section of bone bilateral no indications pathology

## 2020-03-06 ENCOUNTER — Telehealth: Payer: Self-pay

## 2020-03-06 NOTE — Telephone Encounter (Signed)
Pt would like to know when she would be able to drive and how long she should keep her feet elevated. Please advise

## 2020-03-07 NOTE — Telephone Encounter (Signed)
Pt has been updated with the following information.  

## 2020-03-07 NOTE — Telephone Encounter (Signed)
She can drive and elevate as needed

## 2020-03-15 ENCOUNTER — Encounter: Payer: Self-pay | Admitting: Podiatry

## 2020-03-18 ENCOUNTER — Encounter: Payer: Self-pay | Admitting: Podiatry

## 2020-03-18 ENCOUNTER — Ambulatory Visit (INDEPENDENT_AMBULATORY_CARE_PROVIDER_SITE_OTHER): Payer: 59

## 2020-03-18 ENCOUNTER — Ambulatory Visit (INDEPENDENT_AMBULATORY_CARE_PROVIDER_SITE_OTHER): Payer: 59 | Admitting: Podiatry

## 2020-03-18 ENCOUNTER — Other Ambulatory Visit: Payer: Self-pay

## 2020-03-18 DIAGNOSIS — M2042 Other hammer toe(s) (acquired), left foot: Secondary | ICD-10-CM

## 2020-03-18 DIAGNOSIS — M2041 Other hammer toe(s) (acquired), right foot: Secondary | ICD-10-CM

## 2020-03-20 NOTE — Progress Notes (Signed)
Subjective:   Patient ID: Valerie Spence, female   DOB: 60 y.o.   MRN: [id number]   HPI Patient states she is doing well with surgery and is very pleased so far with results   ROS      Objective:  Physical Exam  Neurovascular status intact negative Bevelyn Buckles' sign noted wound edges coapted well bilateral good alignment noted pins in place     Assessment:  Doing well post forefoot reconstruction bilateral     Plan:  H&P reviewed both conditions and went ahead today stitches removed wound edges coapted well advised on continued elevation compression immobilization reappoint 2 weeks pin removal earlier if needed  X-rays indicate structure looks good toes in good position healing well

## 2020-03-20 NOTE — Telephone Encounter (Signed)
done

## 2020-04-01 ENCOUNTER — Ambulatory Visit (INDEPENDENT_AMBULATORY_CARE_PROVIDER_SITE_OTHER): Payer: 59

## 2020-04-01 ENCOUNTER — Encounter: Payer: Self-pay | Admitting: Podiatry

## 2020-04-01 ENCOUNTER — Ambulatory Visit (INDEPENDENT_AMBULATORY_CARE_PROVIDER_SITE_OTHER): Payer: 59 | Admitting: Podiatry

## 2020-04-01 ENCOUNTER — Other Ambulatory Visit: Payer: Self-pay

## 2020-04-01 DIAGNOSIS — M2042 Other hammer toe(s) (acquired), left foot: Secondary | ICD-10-CM

## 2020-04-01 DIAGNOSIS — M2041 Other hammer toe(s) (acquired), right foot: Secondary | ICD-10-CM

## 2020-04-01 NOTE — Progress Notes (Signed)
Subjective:   Patient ID: Valerie Spence, female   DOB: 60 y.o.   MRN: [id number]   HPI Patient states doing very well with surgery and ready to get the pin out of her toe neuro   ROS      Objective:  Physical Exam  Vascular status intact negative Bevelyn Buckles' sign noted patient's digits healing well wound edges well coapted pin intact second digit left     Assessment:  Doing well post foot surgery bilateral mild swelling but improving     Plan:  H&P reviewed condition recommended the continuation of anti-inflammatories as needed pin removed sterile dressing applied x-rays reviewed May gradual return to soft shoe gear reappoint as needed  X-rays indicate that there is good positional component with good alignment of the lesser digits bilateral satisfactory section of bone first metatarsal right

## 2020-04-29 ENCOUNTER — Telehealth: Payer: Self-pay | Admitting: *Deleted

## 2020-04-29 NOTE — Telephone Encounter (Signed)
Patient is calling with concerns that her toe still has 1 stitch remaining ,just noticed (right foot). Please advise.

## 2020-05-01 NOTE — Telephone Encounter (Signed)
She can run in tomorrow or Friday without appointment and we will take care of

## 2020-05-01 NOTE — Telephone Encounter (Signed)
Returned call to patient, no answer, left Vmessage per Dr Mellody Drown note concerning removing a stitch left in toe.

## 2020-05-02 NOTE — Telephone Encounter (Signed)
If patient calls the office please let her know that she can come in today or tomorrow and the nurse will remove the stitch for her.

## 2020-05-08 ENCOUNTER — Other Ambulatory Visit: Payer: Self-pay

## 2020-05-08 ENCOUNTER — Ambulatory Visit: Payer: 59 | Attending: Internal Medicine

## 2020-05-08 ENCOUNTER — Other Ambulatory Visit (HOSPITAL_BASED_OUTPATIENT_CLINIC_OR_DEPARTMENT_OTHER): Payer: Self-pay

## 2020-05-08 DIAGNOSIS — Z23 Encounter for immunization: Secondary | ICD-10-CM

## 2020-05-08 MED ORDER — PFIZER-BIONT COVID-19 VAC-TRIS 30 MCG/0.3ML IM SUSP
INTRAMUSCULAR | 0 refills | Status: AC
  Filled 2020-05-08: qty 0.3, 1d supply, fill #0

## 2020-05-08 NOTE — Progress Notes (Signed)
   Covid-19 Vaccination Clinic  Name:  Valerie Spence    MRN: [id number] DOB: 08-Aug-1960  05/08/2020  [name] was observed post Covid-19 immunization for 15 minutes without incident. She was provided with Vaccine Information Sheet and instruction to access the V-Safe system.   [name] was instructed to call 911 with any severe reactions post vaccine: Marland Kitchen Difficulty breathing  . Swelling of face and throat  . A fast heartbeat  . A bad rash all over body  . Dizziness and weakness   Immunizations Administered    Name Date Dose VIS Date Route   PFIZER Comrnaty(Gray TOP) Covid-19 Vaccine 05/08/2020  1:20 PM 0.3 mL 12/21/2019 Intramuscular   Manufacturer: [place]   Lot: [id number]   [place]: [phone]

## 2020-07-31 ENCOUNTER — Other Ambulatory Visit (HOSPITAL_COMMUNITY): Payer: Self-pay

## 2020-07-31 DIAGNOSIS — Z01419 Encounter for gynecological examination (general) (routine) without abnormal findings: Secondary | ICD-10-CM | POA: Diagnosis not present

## 2020-07-31 DIAGNOSIS — N959 Unspecified menopausal and perimenopausal disorder: Secondary | ICD-10-CM | POA: Diagnosis not present

## 2020-07-31 MED ORDER — PROGESTERONE MICRONIZED 100 MG PO CAPS
ORAL_CAPSULE | ORAL | 4 refills | Status: DC
  Filled 2020-07-31: qty 90, 90d supply, fill #0
  Filled 2020-11-04: qty 90, 90d supply, fill #1
  Filled 2021-02-03: qty 90, 90d supply, fill #2
  Filled 2021-05-05: qty 90, 90d supply, fill #3
  Filled 2021-07-30: qty 90, 90d supply, fill #4

## 2020-07-31 MED ORDER — ESTRADIOL 0.5 MG PO TABS
ORAL_TABLET | ORAL | 4 refills | Status: DC
  Filled 2020-07-31: qty 90, 90d supply, fill #0
  Filled 2020-11-04: qty 90, 90d supply, fill #1
  Filled 2021-02-03: qty 90, 90d supply, fill #2
  Filled 2021-05-05: qty 90, 90d supply, fill #3
  Filled 2021-07-30: qty 90, 90d supply, fill #4

## 2020-08-02 ENCOUNTER — Other Ambulatory Visit: Payer: Self-pay | Admitting: Obstetrics and Gynecology

## 2020-08-02 DIAGNOSIS — E2839 Other primary ovarian failure: Secondary | ICD-10-CM

## 2020-08-30 ENCOUNTER — Ambulatory Visit (INDEPENDENT_AMBULATORY_CARE_PROVIDER_SITE_OTHER): Payer: 59 | Admitting: Family Medicine

## 2020-08-30 ENCOUNTER — Ambulatory Visit (INDEPENDENT_AMBULATORY_CARE_PROVIDER_SITE_OTHER): Payer: 59

## 2020-08-30 ENCOUNTER — Other Ambulatory Visit: Payer: Self-pay

## 2020-08-30 ENCOUNTER — Ambulatory Visit: Payer: Self-pay

## 2020-08-30 VITALS — BP 102/70 | HR 53 | Wt 146.0 lb

## 2020-08-30 DIAGNOSIS — M222X2 Patellofemoral disorders, left knee: Secondary | ICD-10-CM | POA: Diagnosis not present

## 2020-08-30 DIAGNOSIS — M25562 Pain in left knee: Secondary | ICD-10-CM

## 2020-08-30 DIAGNOSIS — M222X1 Patellofemoral disorders, right knee: Secondary | ICD-10-CM

## 2020-08-30 DIAGNOSIS — M25561 Pain in right knee: Secondary | ICD-10-CM

## 2020-08-30 NOTE — Patient Instructions (Addendum)
Xray today Exercises Do more biking than walking or running Do not deeply squat Less heels No lunges Vit D 2000IU daily See me in 6-8 weeks

## 2020-08-30 NOTE — Assessment & Plan Note (Addendum)
Patellofemoral pain bilaterally.  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  Patient does have some mild patellar grinding noted.  Likely some arthritis and x-rays are pending.  Discussed with patient icing regimen and home exercises.  Increase activity slowly.  Patient wanted to avoid any type of anti-inflammatories.  Follow-up with me again in 6 weeks

## 2020-08-30 NOTE — Progress Notes (Signed)
[facility] [place] Phone: [phone] Subjective:   [name], am serving as a scribe for Dr. Hulan Saas.  I'm seeing this patient by the request  of:  [name], DO  CC: knee pain   [id number]  Valerie Spence is a 60 y.o. female coming in with complaint of bilateral knee pain X 6 months ago, locates pain to below knee cap, describes pain as a dull aching. No mechanical symptoms. worse from sitting to standing, stairs, and squat position. Not doing anything for pain. Patient states that standing is better so she has her standing desk.        No past medical history on file. No past surgical history on file. Social History   Socioeconomic History   Marital status: Married    Spouse name: Not on file   Number of children: Not on file   Years of education: Not on file   Highest education level: Not on file  Occupational History   Not on file  Tobacco Use   Smoking status: Never   Smokeless tobacco: Never  Substance and Sexual Activity   Alcohol use: Not on file   Drug use: Not on file   Sexual activity: Not on file  Other Topics Concern   Not on file  Social History Narrative   Not on file   Social Determinants of Health   Financial Resource Strain: Not on file  Food Insecurity: Not on file  Transportation Needs: Not on file  Physical Activity: Not on file  Stress: Not on file  Social Connections: Not on file   No Known Allergies Family History  Problem Relation Age of Onset   Diabetes Other    Hypertension Other     Current Outpatient Medications (Endocrine & Metabolic):    estradiol (ESTRACE) 0.5 MG tablet, Take 1 tablet by mouth every day   progesterone (PROMETRIUM) 100 MG capsule, Take 1 capsule by mouth every day as directed.      Current Outpatient Medications (Other):    Biotin 1 MG CAPS, biotin   Calcium Polycarbophil (FIBERTAB PO), Fibertab   Cholecalciferol  (VITAMIN D3 PO), Take 1 tablet by mouth daily.   COVID-19 mRNA Vac-TriS, Pfizer, (PFIZER-BIONT COVID-19 VAC-TRIS) SUSP injection, Inject into the muscle.   Multiple Vitamins-Minerals (MULTIVITAMIN ADULT PO), multivitamin   Omega-3 Fatty Acids (FISH OIL PO), Fish Oil   Reviewed prior external information including notes and imaging from  primary care provider As well as notes that were available from care everywhere and other healthcare systems.  Past medical history, social, surgical and family history all reviewed in electronic medical record.  No pertanent information unless stated regarding to the chief complaint.   Review of Systems:  No headache, visual changes, nausea, vomiting, diarrhea, constipation, dizziness, abdominal pain, skin rash, fevers, chills, night sweats, weight loss, swollen lymph nodes, body aches, joint swelling, chest pain, shortness of breath, mood changes. POSITIVE muscle aches  Objective  Blood pressure 102/70, pulse (!) 53, weight 146 lb (66.2 kg), SpO2 99 %.   General: No apparent distress alert and oriented x3 mood and affect normal, dressed appropriately.  HEENT: Pupils equal, extraocular movements intact  Respiratory: Patient's speak in full sentences and does not appear short of breath  Cardiovascular: No lower extremity edema, non tender, no erythema  Bilaterally knee does have some mild patellofemoral lateral tracking positive patellar grind test noted.  No significant instability noted to the knee  noted.  Limited muscular skeletal ultrasound was performed and interpreted by Hulan Saas, M  Patient does show some mild narrowing of the joint but no hypoechoic change.  Meniscus appear to be fairly unremarkable.  Patient has a hypertrophy of the fat pad.  No significant abnormality noted of the meniscus.    Impression and Recommendations:     The above documentation has been reviewed and is accurate and complete Lyndal Pulley, DO

## 2020-09-05 DIAGNOSIS — Z1231 Encounter for screening mammogram for malignant neoplasm of breast: Secondary | ICD-10-CM | POA: Diagnosis not present

## 2020-10-03 ENCOUNTER — Other Ambulatory Visit (HOSPITAL_BASED_OUTPATIENT_CLINIC_OR_DEPARTMENT_OTHER): Payer: Self-pay

## 2020-10-03 ENCOUNTER — Other Ambulatory Visit (HOSPITAL_COMMUNITY): Payer: Self-pay

## 2020-10-03 ENCOUNTER — Ambulatory Visit: Payer: 59 | Attending: Internal Medicine

## 2020-10-03 DIAGNOSIS — Z23 Encounter for immunization: Secondary | ICD-10-CM

## 2020-10-03 MED ORDER — PFIZER COVID-19 VAC BIVALENT 30 MCG/0.3ML IM SUSP
INTRAMUSCULAR | 0 refills | Status: AC
  Filled 2020-10-03: qty 0.3, 1d supply, fill #0

## 2020-10-03 NOTE — Progress Notes (Signed)
   Covid-19 Vaccination Clinic  Name:  Valerie Spence    MRN: [id number] DOB: 1960/10/18  10/03/2020  Valerie Spence was observed post Covid-19 immunization for 15 minutes without incident. She was provided with Vaccine Information Sheet and instruction to access the V-Safe system.   Valerie Spence was instructed to call 911 with any severe reactions post vaccine: Difficulty breathing  Swelling of face and throat  A fast heartbeat  A bad rash all over body  Dizziness and weakness

## 2020-10-15 NOTE — Progress Notes (Signed)
Valerie Spence Phone: [phone] Subjective:   Fontaine No, am serving as a scribe for Dr. Hulan Saas. This visit occurred during the SARS-CoV-2 public health emergency.  Safety protocols were in place, including screening questions prior to the visit, additional usage of staff PPE, and extensive cleaning of exam room while observing appropriate contact time as indicated for disinfecting solutions.   I'm seeing this patient by the request  of:  [name], DO  CC: Bilateral knee pain  LYY:[id number]  08/30/2020 Patellofemoral pain bilaterally.  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  Patient does have some mild patellar grinding noted.  Likely some arthritis and x-rays are pending.  Discussed with patient icing regimen and home exercises.  Increase activity slowly.  Patient wanted to avoid any type of anti-inflammatories.  Follow-up with me again in 6 weeks   Updated 10/16/2020 Valerie Spence is a 60 y.o. female coming in with complaint of bilateral knee pain patient was given home exercises, icing regimen, and x-rays as stated we have been unremarkable.  Patient states that she has been doing exercises and step class. Pain has subsided.   Xray (-) of the knees  Onset-  Location Duration-  Character- Aggravating factors- Reliving factors-  Therapies tried-  Severity-     No past medical history on file. No past surgical history on file. Social History   Socioeconomic History   Marital status: Married    Spouse name: Not on file   Number of children: Not on file   Years of education: Not on file   Highest education level: Not on file  Occupational History   Not on file  Tobacco Use   Smoking status: Never   Smokeless tobacco: Never  Substance and Sexual Activity   Alcohol use: Not on file   Drug use: Not on file   Sexual activity: Not on file   Other Topics Concern   Not on file  Social History Narrative   Not on file   Social Determinants of Health   Financial Resource Strain: Not on file  Food Insecurity: Not on file  Transportation Needs: Not on file  Physical Activity: Not on file  Stress: Not on file  Social Connections: Not on file   No Known Allergies Family History  Problem Relation Age of Onset   Diabetes Other    Hypertension Other     Current Outpatient Medications (Endocrine & Metabolic):    estradiol (ESTRACE) 0.5 MG tablet, Take 1 tablet by mouth every day   progesterone (PROMETRIUM) 100 MG capsule, Take 1 capsule by mouth every day as directed.      Current Outpatient Medications (Other):    Biotin 1 MG CAPS, biotin   Calcium Polycarbophil (FIBERTAB PO), Fibertab   Cholecalciferol (VITAMIN D3 PO), Take 1 tablet by mouth daily.   COVID-19 mRNA bivalent vaccine, Pfizer, (PFIZER COVID-19 VAC BIVALENT) injection, Inject into the muscle.   COVID-19 mRNA Vac-TriS, Pfizer, (PFIZER-BIONT COVID-19 VAC-TRIS) SUSP injection, Inject into the muscle.   Multiple Vitamins-Minerals (MULTIVITAMIN ADULT PO), multivitamin   Omega-3 Fatty Acids (FISH OIL PO), Fish Oil    Review of Systems:  No headache, visual changes, nausea, vomiting, diarrhea, constipation, dizziness, abdominal pain, skin rash, fevers, chills, night sweats, weight loss, swollen lymph nodes, body aches, joint swelling, chest pain, shortness of breath, mood changes. POSITIVE muscle aches  Objective  Blood pressure 102/64,  pulse (!) 59, height 5\' 5"  (1.651 m), weight 151 lb (68.5 kg), SpO2 98 %.   General: No apparent distress alert and oriented x3 mood and affect normal, dressed appropriately.  HEENT: Pupils equal, extraocular movements intact  Respiratory: Patient's speak in full sentences and does not appear short of breath  Cardiovascular: No lower extremity edema, non tender, no erythema  Gait normal with good balance and coordination.   MSK: Bilateral knee showed no significant swelling at this time.  Patient does have a mild patella alto seems on the right side with still lateral tracking with mild crepitus.  Patient now does not have any pain.  No significant instability noted of the knees bilaterally.  Nontender on exam.    Impression and Recommendations:     The above documentation has been reviewed and is accurate and complete Lyndal Pulley, DO

## 2020-10-16 ENCOUNTER — Other Ambulatory Visit: Payer: Self-pay

## 2020-10-16 ENCOUNTER — Ambulatory Visit (INDEPENDENT_AMBULATORY_CARE_PROVIDER_SITE_OTHER): Payer: 59 | Admitting: Family Medicine

## 2020-10-16 DIAGNOSIS — M222X2 Patellofemoral disorders, left knee: Secondary | ICD-10-CM

## 2020-10-16 DIAGNOSIS — M222X1 Patellofemoral disorders, right knee: Secondary | ICD-10-CM | POA: Diagnosis not present

## 2020-10-16 NOTE — Patient Instructions (Signed)
Keep doing exercises 3x a week Go get hair done See me in 3 months if any pain

## 2020-10-16 NOTE — Assessment & Plan Note (Signed)
Patient is making significant improvement already.  Discussed that we do continue to work on [employer].  X-rays did not show any significant arthritic changes.  Do feel that the new shoes will be helpful as well.  Patient can follow-up with me on an as-needed basis but will schedule an appointment in 3 months just in case.  Worsening pain will need to consider still formal physical therapy and injection

## 2020-11-04 ENCOUNTER — Other Ambulatory Visit (HOSPITAL_COMMUNITY): Payer: Self-pay

## 2020-12-03 DIAGNOSIS — H524 Presbyopia: Secondary | ICD-10-CM | POA: Diagnosis not present

## 2020-12-03 DIAGNOSIS — Z135 Encounter for screening for eye and ear disorders: Secondary | ICD-10-CM | POA: Diagnosis not present

## 2020-12-31 ENCOUNTER — Other Ambulatory Visit (HOSPITAL_COMMUNITY): Payer: Self-pay

## 2020-12-31 DIAGNOSIS — N952 Postmenopausal atrophic vaginitis: Secondary | ICD-10-CM | POA: Diagnosis not present

## 2020-12-31 DIAGNOSIS — N760 Acute vaginitis: Secondary | ICD-10-CM | POA: Diagnosis not present

## 2020-12-31 MED ORDER — ESTRADIOL 0.1 MG/GM VA CREA
TOPICAL_CREAM | VAGINAL | 4 refills | Status: AC
  Filled 2020-12-31: qty 42.5, 90d supply, fill #0

## 2021-01-01 DIAGNOSIS — R7303 Prediabetes: Secondary | ICD-10-CM | POA: Diagnosis not present

## 2021-01-01 DIAGNOSIS — Z13220 Encounter for screening for lipoid disorders: Secondary | ICD-10-CM | POA: Diagnosis not present

## 2021-01-01 DIAGNOSIS — R7309 Other abnormal glucose: Secondary | ICD-10-CM | POA: Diagnosis not present

## 2021-01-01 DIAGNOSIS — Z0000 Encounter for general adult medical examination without abnormal findings: Secondary | ICD-10-CM | POA: Diagnosis not present

## 2021-01-02 ENCOUNTER — Other Ambulatory Visit (HOSPITAL_COMMUNITY): Payer: Self-pay

## 2021-01-02 MED ORDER — FLUCONAZOLE 150 MG PO TABS
ORAL_TABLET | ORAL | 0 refills | Status: AC
  Filled 2021-01-02: qty 2, 6d supply, fill #0

## 2021-01-02 MED ORDER — METRONIDAZOLE 500 MG PO TABS
ORAL_TABLET | ORAL | 0 refills | Status: AC
Start: 2021-01-02 — End: ?
  Filled 2021-01-02: qty 14, 7d supply, fill #0

## 2021-01-16 ENCOUNTER — Ambulatory Visit: Payer: 59 | Admitting: Family Medicine

## 2021-01-24 ENCOUNTER — Ambulatory Visit
Admission: RE | Admit: 2021-01-24 | Discharge: 2021-01-24 | Disposition: A | Discharge status: Home / Self Care | Payer: 59 | Source: Ambulatory Visit | Attending: Obstetrics and Gynecology | Admitting: Obstetrics and Gynecology

## 2021-01-24 DIAGNOSIS — M85851 Other specified disorders of bone density and structure, right thigh: Secondary | ICD-10-CM | POA: Diagnosis not present

## 2021-01-24 DIAGNOSIS — E2839 Other primary ovarian failure: Secondary | ICD-10-CM

## 2021-01-24 DIAGNOSIS — Z780 Asymptomatic menopausal state: Secondary | ICD-10-CM | POA: Diagnosis not present

## 2021-02-03 ENCOUNTER — Other Ambulatory Visit (HOSPITAL_COMMUNITY): Payer: Self-pay

## 2021-03-24 ENCOUNTER — Other Ambulatory Visit (HOSPITAL_COMMUNITY): Payer: Self-pay

## 2021-05-05 ENCOUNTER — Other Ambulatory Visit (HOSPITAL_COMMUNITY): Payer: Self-pay

## 2021-07-25 ENCOUNTER — Other Ambulatory Visit: Payer: Self-pay

## 2021-07-25 DIAGNOSIS — R21 Rash and other nonspecific skin eruption: Secondary | ICD-10-CM | POA: Diagnosis not present

## 2021-07-30 ENCOUNTER — Other Ambulatory Visit (HOSPITAL_COMMUNITY): Payer: Self-pay

## 2021-09-12 ENCOUNTER — Other Ambulatory Visit (HOSPITAL_COMMUNITY): Payer: Self-pay

## 2021-09-12 DIAGNOSIS — Z1231 Encounter for screening mammogram for malignant neoplasm of breast: Secondary | ICD-10-CM | POA: Diagnosis not present

## 2021-09-12 DIAGNOSIS — Z01419 Encounter for gynecological examination (general) (routine) without abnormal findings: Secondary | ICD-10-CM | POA: Diagnosis not present

## 2021-09-12 MED ORDER — ESTRADIOL 0.5 MG PO TABS
0.5000 mg | ORAL_TABLET | Freq: Every day | ORAL | 4 refills | Status: DC
  Filled 2021-09-12 – 2021-11-02 (×2): qty 90, 90d supply, fill #0
  Filled 2022-01-16: qty 90, 90d supply, fill #1
  Filled 2022-04-24: qty 90, 90d supply, fill #2
  Filled 2022-07-27: qty 90, 90d supply, fill #3

## 2021-09-12 MED ORDER — TRIAMCINOLONE ACETONIDE 0.5 % EX CREA
TOPICAL_CREAM | CUTANEOUS | 0 refills | Status: AC
  Filled 2021-09-12: qty 15, 7d supply, fill #0

## 2021-09-12 MED ORDER — PROGESTERONE MICRONIZED 100 MG PO CAPS
100.0000 mg | ORAL_CAPSULE | Freq: Every day | ORAL | 4 refills | Status: AC
  Filled 2021-09-12: qty 90, 90d supply, fill #0
  Filled 2021-11-02: qty 29, 29d supply, fill #0
  Filled 2021-11-03: qty 36, 84d supply, fill #0
  Filled 2021-12-08 – 2021-12-11 (×2): qty 36, 84d supply, fill #1

## 2021-11-03 ENCOUNTER — Other Ambulatory Visit (HOSPITAL_COMMUNITY): Payer: Self-pay

## 2021-11-10 ENCOUNTER — Other Ambulatory Visit (HOSPITAL_BASED_OUTPATIENT_CLINIC_OR_DEPARTMENT_OTHER): Payer: Self-pay

## 2021-11-10 MED ORDER — COMIRNATY 30 MCG/0.3ML IM SUSY
PREFILLED_SYRINGE | INTRAMUSCULAR | 0 refills | Status: AC
  Filled 2021-11-10: qty 0.3, 1d supply, fill #0

## 2021-11-13 ENCOUNTER — Other Ambulatory Visit (HOSPITAL_BASED_OUTPATIENT_CLINIC_OR_DEPARTMENT_OTHER): Payer: Self-pay

## 2021-11-13 ENCOUNTER — Other Ambulatory Visit (HOSPITAL_COMMUNITY): Payer: Self-pay

## 2021-12-08 ENCOUNTER — Ambulatory Visit (INDEPENDENT_AMBULATORY_CARE_PROVIDER_SITE_OTHER): Payer: 59 | Admitting: Podiatry

## 2021-12-08 ENCOUNTER — Encounter: Payer: Self-pay | Admitting: Podiatry

## 2021-12-08 ENCOUNTER — Ambulatory Visit (INDEPENDENT_AMBULATORY_CARE_PROVIDER_SITE_OTHER): Payer: 59

## 2021-12-08 ENCOUNTER — Other Ambulatory Visit (HOSPITAL_COMMUNITY): Payer: Self-pay

## 2021-12-08 DIAGNOSIS — M2041 Other hammer toe(s) (acquired), right foot: Secondary | ICD-10-CM

## 2021-12-08 DIAGNOSIS — M2042 Other hammer toe(s) (acquired), left foot: Secondary | ICD-10-CM

## 2021-12-10 NOTE — Progress Notes (Signed)
Subjective:   Patient ID: Valerie Spence, female   DOB: 61 y.o.   MRN: [id number]   HPI Patient here for check of her chronic digital deformities of both feet which have been worked on in the past.  She has had previous surgery by other physicians and we were able to fix some of the deformity and states she is much better than she was but gets occasional discomfort   ROS      Objective:  Physical Exam  Neurovascular status intact muscle strength found to be adequate range of motion adequate with patient found to have relative good position of the underlying digits with mild keratotic lesion between the hallux second digit right over left and mild structural deformity.  Overall doing well     Assessment:  Previous foot surgeries which seem to be doing well with mild inflammation between the hallux second toe bilateral     Plan:  H&P x-rays reviewed discussed deformity with patient.  Do not recommend current surgery but I do recommend wider type shoe gear and padding as needed and that someday in the future it is possible further surgery may be necessary but would like to try to hold off if possible.  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

## 2021-12-11 ENCOUNTER — Other Ambulatory Visit (HOSPITAL_COMMUNITY): Payer: Self-pay

## 2021-12-11 MED ORDER — PROGESTERONE MICRONIZED 100 MG PO CAPS
100.0000 mg | ORAL_CAPSULE | Freq: Every day | ORAL | 3 refills | Status: AC
  Filled 2021-12-11: qty 90, 90d supply, fill #0
  Filled 2021-12-11: qty 95, 95d supply, fill #0
  Filled 2022-03-12: qty 90, 90d supply, fill #1
  Filled 2022-06-08: qty 90, 90d supply, fill #2
  Filled 2022-09-16: qty 90, 90d supply, fill #3

## 2022-01-02 DIAGNOSIS — R7303 Prediabetes: Secondary | ICD-10-CM | POA: Diagnosis not present

## 2022-01-02 DIAGNOSIS — Z13220 Encounter for screening for lipoid disorders: Secondary | ICD-10-CM | POA: Diagnosis not present

## 2022-01-02 DIAGNOSIS — Z0000 Encounter for general adult medical examination without abnormal findings: Secondary | ICD-10-CM | POA: Diagnosis not present

## 2022-01-09 ENCOUNTER — Other Ambulatory Visit (HOSPITAL_COMMUNITY): Payer: Self-pay

## 2022-01-16 ENCOUNTER — Other Ambulatory Visit (HOSPITAL_COMMUNITY): Payer: Self-pay

## 2022-01-16 ENCOUNTER — Encounter (HOSPITAL_COMMUNITY): Payer: Self-pay

## 2022-01-20 ENCOUNTER — Other Ambulatory Visit (HOSPITAL_COMMUNITY): Payer: Self-pay

## 2022-03-12 ENCOUNTER — Other Ambulatory Visit (HOSPITAL_COMMUNITY): Payer: Self-pay

## 2022-04-16 ENCOUNTER — Ambulatory Visit: Payer: 59 | Admitting: Podiatry

## 2022-04-16 ENCOUNTER — Ambulatory Visit (INDEPENDENT_AMBULATORY_CARE_PROVIDER_SITE_OTHER): Payer: 59

## 2022-04-16 DIAGNOSIS — M2041 Other hammer toe(s) (acquired), right foot: Secondary | ICD-10-CM | POA: Diagnosis not present

## 2022-04-16 DIAGNOSIS — M2042 Other hammer toe(s) (acquired), left foot: Secondary | ICD-10-CM | POA: Diagnosis not present

## 2022-04-16 DIAGNOSIS — M21619 Bunion of unspecified foot: Secondary | ICD-10-CM | POA: Diagnosis not present

## 2022-04-16 NOTE — Progress Notes (Signed)
Subjective: Chief Complaint  Patient presents with   Callouses    Right foot possible callus or corn    62 year old female presents the office for above concerns.  She states that she has a bunion on her right foot that is present second toe causing pain.  She is previous toe hammertoe surgery was performed.  She started getting callus on the area of the bunion as well.  She is having tenderness of the bunion with pressure.  She tried shoe modifications, padding without significant improvement.  Objective: AAO x3, NAD DP/PT pulses palpable bilaterally, CRT less than 3 seconds Moderate bunion is present on the right foot that is abutting the second toe.  There is tenderness palpation workup to the second toe but also in the area of the bunion.  Flexible deformity noted of the second digit is not causing pain on the second toe itself except where the toe is rubbing.  Hyperkeratotic lesion along the area of the bunion medial first metatarsal head without any underlying ulceration drainage or signs of infection.  There is no crepitation with first major motion. No pain with calf compression, swelling, warmth, erythema  Assessment: 62 year old female symptomatic bunion right foot  Plan: -All treatment options discussed with the patient including all alternatives, risks, complications.  -X-rays were obtained reviewed.  3 views of the foot were obtained.  Moderate increase in first, second intermetatarsal angle.  Narrowing of the first MPJ. -We discussed the conservative as well as surgical treatment options.  She wants to proceed with surgical intervention.  We discussed given narrowing she may need to proceed first major arthrodesis but she does not proceed with that.  She has good range of motion we will proceed with Minta BalsamAustin, [place].  She is aware that she may have further surgery in the future particularly if the joint is arthritic now or becomes arthritic. -The incision placement as well as the  postoperative course was discussed with the patient. I discussed risks of the surgery which include, but not limited to, infection, bleeding, pain, swelling, need for further surgery, delayed or nonhealing, painful or ugly scar, numbness or sensation changes, over/under correction, recurrence, transfer lesions, further deformity, hardware failure, DVT/PE, loss of toe/foot. Patient understands these risks and wishes to proceed with surgery. The surgical consent was reviewed with the patient all 3 pages were signed. No promises or guarantees were given to the outcome of the procedure. All questions were answered to the best of my ability. Before the surgery the patient was encouraged to call the office if there is any further questions. The surgery [facility] on an outpatient basis. -Patient encouraged to call the office with any questions, concerns, change in symptoms.   Vivi BarrackMatthew R Mckena Chern DPM

## 2022-04-16 NOTE — Patient Instructions (Signed)
Pre-Operative Instructions  Congratulations, you have decided to take an important step to improving your quality of life.  You can be assured that the doctors of [facility] will be with you every step of the way.  Plan to be at the surgery center/hospital at least 1 (one) hour prior to your scheduled time unless otherwise directed by the surgical center/hospital staff.  You must have a responsible adult accompany you, remain during the surgery and drive you home.  Make sure you have directions to the surgical center/hospital and know how to get there on time. For hospital based surgery you will need to obtain a history and physical form from your family physician within 1 month prior to the date of surgery- we will give you a form for you primary physician.  We make every effort to accommodate the date you request for surgery.  There are however, times where surgery dates or times have to be moved.  We will contact you as soon as possible if a change in schedule is required.   No Aspirin/Ibuprofen for one week before surgery.  If you are on aspirin, any non-steroidal anti-inflammatory medications (Mobic, Aleve, Ibuprofen) you should stop taking it 7 days prior to your surgery.  You make take Tylenol  For pain prior to surgery.  Medications- If you are taking daily heart and blood pressure medications, seizure, reflux, allergy, asthma, anxiety, pain or diabetes medications, make sure the surgery center/hospital is aware before the day of surgery so they may notify you which medications to take or avoid the day of surgery. No food or drink after midnight the night before surgery unless directed otherwise by surgical center/hospital staff. No alcoholic beverages 24 hours prior to surgery.  No smoking 24 hours prior to or 24 hours after surgery. Wear loose pants or shorts- loose enough to fit over bandages, boots, and casts. No slip on shoes, sneakers are best. Bring your boot with you to the  surgery center/hospital.  Also bring crutches or a walker if your physician has prescribed it for you.  If you do not have this equipment, it will be provided for you after surgery. If you have not been contracted by the surgery center/hospital by the day before your surgery, call to confirm the date and time of your surgery. Leave-time from work may vary depending on the type of surgery you have.  Appropriate arrangements should be made prior to surgery with your employer. Prescriptions will be provided immediately following surgery by your doctor.  Have these filled as soon as possible after surgery and take the medication as directed. Remove nail polish on the operative foot. Wash the night before surgery.  The night before surgery wash the foot and leg well with the antibacterial soap provided and water paying special attention to beneath the toenails and in between the toes.  Rinse thoroughly with water and dry well with a towel.  Perform this wash unless told not to do so by your physician.  Enclosed: 1 Ice pack (please put in freezer the night before surgery)   1 Hibiclens skin cleaner   Pre-op Instructions  If you have any questions regarding the instructions, do not hesitate to call our office at any point during this process.   [place]: [address] [place], [place] [address] [phone]  [place]: [address] [place], [place] [address] [phone]  Dr. Arnetia Bronk, DPM  

## 2022-04-24 ENCOUNTER — Other Ambulatory Visit: Payer: Self-pay

## 2022-04-29 DIAGNOSIS — M79676 Pain in unspecified toe(s): Secondary | ICD-10-CM

## 2022-05-25 ENCOUNTER — Telehealth: Payer: Self-pay | Admitting: Urology

## 2022-05-25 NOTE — Telephone Encounter (Signed)
DOS - 06/17/22  DOUBLE OSTEOTOMY RIGHT --- 09811  [employer] EFFECTIVE DATE - 01/12/22  PER [employer]'S AUTOMATED SYSTEM FOR CPT CODE 91478 NO PRIOR AUTH IS REQUIRED.  CALL REF # [id number]

## 2022-06-09 ENCOUNTER — Other Ambulatory Visit: Payer: Self-pay

## 2022-06-09 ENCOUNTER — Other Ambulatory Visit: Payer: Self-pay | Admitting: Podiatry

## 2022-06-09 ENCOUNTER — Telehealth: Payer: Self-pay | Admitting: Urology

## 2022-06-09 DIAGNOSIS — M2041 Other hammer toe(s) (acquired), right foot: Secondary | ICD-10-CM

## 2022-06-09 DIAGNOSIS — M21619 Bunion of unspecified foot: Secondary | ICD-10-CM

## 2022-06-09 NOTE — Telephone Encounter (Signed)
Pt called stating that she would be interested in a knee scooter and a boot. I informed pt that she would receive boot day of sx.

## 2022-06-11 ENCOUNTER — Telehealth: Payer: Self-pay | Admitting: Podiatry

## 2022-06-11 ENCOUNTER — Other Ambulatory Visit (HOSPITAL_COMMUNITY): Payer: Self-pay

## 2022-06-11 MED ORDER — CEPHALEXIN 500 MG PO CAPS
500.0000 mg | ORAL_CAPSULE | Freq: Four times a day (QID) | ORAL | 0 refills | Status: AC
  Filled 2022-06-11: qty 28, 7d supply, fill #0

## 2022-06-11 NOTE — Telephone Encounter (Signed)
Patient called.  She has surgery scheduled for 06/17/22.    States she has an ingrown hair on the right 2nd toe.  She tried to pull it out, but the hair got even longer so she stopped.  She was going to let you address on day of surgery, but I told her you don't want a possible infection on the same surgical foot on day of surgery.  I told her I'd send in cephalexin for her to take and she was hoping you could see her and take a quick look at it before surgery.  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    Told her I'd message you to see if you want her to come in this/next week.  -[name]

## 2022-06-12 ENCOUNTER — Other Ambulatory Visit (HOSPITAL_COMMUNITY): Payer: Self-pay

## 2022-06-16 ENCOUNTER — Other Ambulatory Visit (HOSPITAL_COMMUNITY): Payer: Self-pay

## 2022-06-16 ENCOUNTER — Ambulatory Visit (INDEPENDENT_AMBULATORY_CARE_PROVIDER_SITE_OTHER): Payer: 59 | Admitting: Podiatry

## 2022-06-16 ENCOUNTER — Other Ambulatory Visit: Payer: Self-pay

## 2022-06-16 DIAGNOSIS — L731 Pseudofolliculitis barbae: Secondary | ICD-10-CM

## 2022-06-16 MED ORDER — PROMETHAZINE HCL 25 MG PO TABS
25.0000 mg | ORAL_TABLET | Freq: Three times a day (TID) | ORAL | 0 refills | Status: DC | PRN
  Filled 2022-06-16: qty 20, 7d supply, fill #0

## 2022-06-16 MED ORDER — OXYCODONE-ACETAMINOPHEN 5-325 MG PO TABS
1.0000 | ORAL_TABLET | Freq: Four times a day (QID) | ORAL | 0 refills | Status: DC | PRN
  Filled 2022-06-16: qty 25, 3d supply, fill #0

## 2022-06-17 ENCOUNTER — Other Ambulatory Visit: Payer: Self-pay | Admitting: Podiatry

## 2022-06-17 ENCOUNTER — Other Ambulatory Visit (HOSPITAL_COMMUNITY): Payer: Self-pay

## 2022-06-17 ENCOUNTER — Encounter: Payer: Self-pay | Admitting: Podiatry

## 2022-06-17 DIAGNOSIS — M21611 Bunion of right foot: Secondary | ICD-10-CM | POA: Diagnosis not present

## 2022-06-17 DIAGNOSIS — G8918 Other acute postprocedural pain: Secondary | ICD-10-CM | POA: Diagnosis not present

## 2022-06-17 DIAGNOSIS — M2011 Hallux valgus (acquired), right foot: Secondary | ICD-10-CM

## 2022-06-17 MED ORDER — OXYCODONE-ACETAMINOPHEN 5-325 MG PO TABS
1.0000 | ORAL_TABLET | Freq: Four times a day (QID) | ORAL | 0 refills | Status: AC | PRN
  Filled 2022-06-17 (×3): qty 8, 1d supply, fill #0

## 2022-06-17 MED ORDER — PROMETHAZINE HCL 25 MG PO TABS
25.0000 mg | ORAL_TABLET | Freq: Three times a day (TID) | ORAL | 0 refills | Status: AC | PRN
  Filled 2022-06-17 (×3): qty 6, 2d supply, fill #0

## 2022-06-17 NOTE — Progress Notes (Signed)
Sent 2 day course of medications as the ones sent yesterday were mailed and she will not get them until tomorrow.

## 2022-06-23 ENCOUNTER — Encounter: Payer: 59 | Admitting: Podiatry

## 2022-06-24 ENCOUNTER — Ambulatory Visit (HOSPITAL_COMMUNITY)
Admission: RE | Admit: 2022-06-24 | Discharge: 2022-06-24 | Disposition: A | Discharge status: Home / Self Care | Payer: 59 | Source: Ambulatory Visit | Attending: Podiatry | Admitting: Podiatry

## 2022-06-24 ENCOUNTER — Ambulatory Visit (INDEPENDENT_AMBULATORY_CARE_PROVIDER_SITE_OTHER): Payer: 59 | Admitting: Podiatry

## 2022-06-24 ENCOUNTER — Telehealth: Payer: Self-pay | Admitting: Podiatry

## 2022-06-24 ENCOUNTER — Encounter: Payer: Self-pay | Admitting: Podiatry

## 2022-06-24 ENCOUNTER — Other Ambulatory Visit: Payer: Self-pay | Admitting: Podiatry

## 2022-06-24 ENCOUNTER — Ambulatory Visit: Payer: 59

## 2022-06-24 DIAGNOSIS — Z9889 Other specified postprocedural states: Secondary | ICD-10-CM | POA: Insufficient documentation

## 2022-06-24 NOTE — Progress Notes (Signed)
Right lower extremity venous duplex has been completed. Preliminary results can be found in CV Proc through chart review.  Results were given to Dr. Ralene Cork.  06/24/22 2:56 PM Olen Cordial RVT

## 2022-06-24 NOTE — Telephone Encounter (Addendum)
Pt at [facility] long imaging dept and they do not have the order for the ultrasound that you ordered as a stat order.  If any questions we can call the radiology dept at [phone]  Pt request we call her once done as well please   Pt called back and they still said they did not have her scheduled for the u/s and I called the number and pt is now scheduled to have u/s today at 230pm at [facility] instaed of  as they did not have any appts today. I gave them my direct number to call for the report.

## 2022-06-24 NOTE — Progress Notes (Addendum)
Subjective:  Patient ID: Valerie Spence, female    DOB: May 06, 1960,  MRN: [id number]  Chief Complaint  Patient presents with   Routine Post Op    POV # 1 DOS 06/17/22 --- RIGHT FOOT SURGICAL CORRECTION OF BUNION Minta Balsam) WITH HARDWARE FIXATION   Patient states her right calf has been bothering her    DOS: 06/17/22 Procedure: Right foot bunion correction with austin osteotomy.   62 y.o. female returns for POV#1. Relates doing ok but her right calf has been bother her.   Review of Systems: Negative except as noted in the HPI. Denies N/V/F/Ch.  History reviewed. No pertinent past medical history.  Current Outpatient Medications:    Biotin 1 MG CAPS, biotin, Disp: , Rfl:    Calcium Polycarbophil (FIBERTAB PO), Fibertab, Disp: , Rfl:    cephALEXin (KEFLEX) 500 MG capsule, Take 1 capsule (500 mg total) by mouth 4 (four) times daily., Disp: 28 capsule, Rfl: 0   Cholecalciferol (VITAMIN D3 PO), Take 1 tablet by mouth daily., Disp: , Rfl:    COVID-19 mRNA bivalent vaccine, Pfizer, (PFIZER COVID-19 VAC BIVALENT) injection, Inject into the muscle., Disp: 0.3 mL, Rfl: 0   COVID-19 mRNA Vac-TriS, Pfizer, (PFIZER-BIONT COVID-19 VAC-TRIS) SUSP injection, Inject into the muscle., Disp: 0.3 mL, Rfl: 0   COVID-19 mRNA vaccine 2023-2024 (COMIRNATY) syringe, Inject into the muscle., Disp: 0.3 mL, Rfl: 0   estradiol (ESTRACE) 0.1 MG/GM vaginal cream, Insert 1 gram every week vaginally, Disp: 85 g, Rfl: 4   estradiol (ESTRACE) 0.5 MG tablet, Take 1 tablet (0.5 mg total) by mouth daily., Disp: 95 tablet, Rfl: 4   fluconazole (DIFLUCAN) 150 MG tablet, Take 1 tablet by mouth every 72 hours, Disp: 2 tablet, Rfl: 0   metroNIDAZOLE (FLAGYL) 500 MG tablet, Take 1 tablet by mouth every 12 hours for 7 days., Disp: 14 tablet, Rfl: 0   Multiple Vitamins-Minerals (MULTIVITAMIN ADULT PO), multivitamin, Disp: , Rfl:    Omega-3 Fatty Acids (FISH OIL PO), Fish Oil, Disp: , Rfl:    oxyCODONE-acetaminophen  (PERCOCET/ROXICET) 5-325 MG tablet, Take 1 - 2 tablets by mouth every 6 hours as needed for severe pain., Disp: 8 tablet, Rfl: 0   progesterone (PROMETRIUM) 100 MG capsule, Take 1 capsule (100 mg total) by mouth daily for 12 days, Disp: 95 capsule, Rfl: 4   progesterone (PROMETRIUM) 100 MG capsule, Take 1 capsule (100 mg total) by mouth daily., Disp: 95 capsule, Rfl: 3   promethazine (PHENERGAN) 25 MG tablet, Take 1 tablet (25 mg total) by mouth every 8 (eight) hours as needed for nausea or vomiting., Disp: 6 tablet, Rfl: 0   triamcinolone cream (KENALOG) 0.5 %, APPLY A THIN LAYER TO THE AFFECTED AREAS 2 TIMES PER DAY, Disp: 15 g, Rfl: 0  Social History   Tobacco Use  Smoking Status Never  Smokeless Tobacco Never    No Known Allergies Objective:  There were no vitals filed for this visit. There is no height or weight on file to calculate BMI. Constitutional Well developed. Well nourished.  Vascular Foot warm and well perfused. Capillary refill normal to all digits.   Neurologic Normal speech. Oriented to person, place, and time. Epicritic sensation to light touch grossly present bilaterally.  Dermatologic Skin healing well without signs of infection. Skin edges well coapted without signs of infection.  Orthopedic: Tenderness to palpation noted about the surgical site. Tender to the posterior calf to palpation . Minimal edema nor erythema. No pain with DF of the foot.  Radiographs: Hardware intact and toe improved alignment  Assessment:   1. Post-operative state    Plan:  Patient was evaluated and treated and all questions answered.  S/p foot surgery right -Progressing as expected post-operatively. -WB Status: NWB in CAM boot -Sutures: intact. -Medications: n/a -Foot redressed. -Patient with some calf pain for past two days. Concern for possible DVT and would like to rule out. Will send for ultrasound.   Follow-up in 1 week with Dr. Ardelle Anton.  No follow-ups on file.   **  results of ultrasound showed superficial thrombus. Called patient to discuss. Recommended warm compress or heating bad to the back of the calf and taking an anti-inflammatory to help and should hopefully get better.

## 2022-06-24 NOTE — Telephone Encounter (Signed)
"  I just had an appointment with Dr. Ralene Cork for a post-op follow-up.  I'm at Radiology at [facility].  They are having issues with the order that was placed.  I'm trying to get  the Ultrasound done.  If anyone, can give the Radiology Department a call here just to confirm that the order was placed.  That would help."

## 2022-06-26 NOTE — Progress Notes (Signed)
Subjective: Chief Complaint  Patient presents with   Foot Problem   62 year old female presents the office today for concerns of ingrown hair on her right second toe and she is having surgery tomorrow for the bunion.  She has been on Keflex.  No fevers or chills that she reports.  Objective: AAO x3, NAD DP/PT pulses palpable bilaterally, CRT less than 3 seconds On the left second toe there is a ingrown hair present with inflammation around the hair, no purulence noted.  No significant cellulitis.  No red streaks.  No fluctuation, crepitation, malodor. No pain with calf compression, swelling, warmth, erythema  Assessment: Ingrown hair right second toenail  Plan: -All treatment options discussed with the patient including all alternatives, risks, complications.  -In order to remove this as it was quite painful I did not incise the toe.  Cleaned skin with alcohol mixture lidocaine, Marcaine plain was infiltrated for a total of 3 mL.  I cleaned the skin with Betadine.  I was able to curette out the ingrown hair.  There is no purulence or signs of infection.  Irrigated with alcohol.  Antibiotic ointment and dressing applied.  Discussed Epsom salt soaks.  Antibiotic ointment dressing changes daily.  Monitor for any signs or symptoms of infection.  As there is no obvious signs of infection stable proceed with a bunion surgery tomorrow, unless something changes in the meantime.  -Patient encouraged to call the office with any questions, concerns, change in symptoms.   Vivi Barrack DPM

## 2022-07-02 ENCOUNTER — Ambulatory Visit (INDEPENDENT_AMBULATORY_CARE_PROVIDER_SITE_OTHER): Payer: 59 | Admitting: Podiatry

## 2022-07-02 ENCOUNTER — Encounter: Payer: Self-pay | Admitting: Podiatry

## 2022-07-02 DIAGNOSIS — Z9889 Other specified postprocedural states: Secondary | ICD-10-CM

## 2022-07-02 DIAGNOSIS — M21619 Bunion of unspecified foot: Secondary | ICD-10-CM

## 2022-07-02 NOTE — Progress Notes (Signed)
Subjective: Chief Complaint  Patient presents with   Routine Post Op    POV # 2 DOS 06/17/22 --- RIGHT FOOT SURGICAL CORRECTION OF BUNION ([place] [facility]) WITH HARDWARE FIXATION   62 year old female presents the office for above concerns.  She said that she is doing well and no significant pain.  No fevers or chills.  She has no new concerns today.  Objective: AAO x3, NAD DP/PT pulses palpable bilaterally, CRT less than 3 seconds On the right foot incision well coapted with sutures intact.  There is minimal edema.  There is no warmth.  No drainage or pus or any signs of infection.  No significant pain on exam.  Toe is rectus. No pain with calf compression, swelling, warmth, erythema  Assessment: Status post right foot bunionectomy  Plan: -All treatment options discussed with the patient including all alternatives, risks, complications.  -Incision appears to be healing well.  A small amount of antibiotic ointment and a bandage applied.  Discussed that she can wash the foot with soap and water and apply a similar bandage. -Continue offloading boot. -Ice, elevation, compression -Will plan on returning to work on July 12.  She will also work from home for 2 weeks from then. -Patient encouraged to call the office with any questions, concerns, change in symptoms.   X-ray next appointment  Vivi Barrack DPM

## 2022-07-03 DIAGNOSIS — M79676 Pain in unspecified toe(s): Secondary | ICD-10-CM

## 2022-07-08 ENCOUNTER — Telehealth: Payer: Self-pay | Admitting: *Deleted

## 2022-07-08 NOTE — Telephone Encounter (Signed)
Patient is calling because she has developed an itchy rash on side of right calf of her post surgery foot,it is above the ankle, does not seem to be spreading. She has been using cortisone cream on it but does not to be seem to be helping.  She is not taking any new med's , could be heat since she was outside one day ago,not sure. If something is called into pharmacy, please check the pick up box (usually has mail order).

## 2022-07-09 ENCOUNTER — Other Ambulatory Visit (HOSPITAL_COMMUNITY): Payer: Self-pay

## 2022-07-09 ENCOUNTER — Other Ambulatory Visit: Payer: Self-pay | Admitting: Podiatry

## 2022-07-09 MED ORDER — TRIAMCINOLONE ACETONIDE 0.025 % EX OINT
1.0000 | TOPICAL_OINTMENT | Freq: Two times a day (BID) | CUTANEOUS | 0 refills | Status: AC
  Filled 2022-07-09: qty 30, 15d supply, fill #0

## 2022-07-09 NOTE — Telephone Encounter (Signed)
Patient has been updated and will pick up cream.

## 2022-07-14 ENCOUNTER — Ambulatory Visit (INDEPENDENT_AMBULATORY_CARE_PROVIDER_SITE_OTHER): Payer: 59 | Admitting: Podiatry

## 2022-07-14 ENCOUNTER — Ambulatory Visit (INDEPENDENT_AMBULATORY_CARE_PROVIDER_SITE_OTHER): Payer: 59

## 2022-07-14 DIAGNOSIS — Z9889 Other specified postprocedural states: Secondary | ICD-10-CM | POA: Diagnosis not present

## 2022-07-14 DIAGNOSIS — M2011 Hallux valgus (acquired), right foot: Secondary | ICD-10-CM | POA: Diagnosis not present

## 2022-07-14 NOTE — Progress Notes (Signed)
Subjective:   Patient ID: Valerie Spence, female   DOB: 62 y.o.   MRN: [id number]   HPI Patient states doing well with surgery and I am concerned because my toe is up in the air somewhat.  She points to her big toe   ROS      Objective:  Physical Exam  Neurovascular status intact negative Denna Haggard' sign noted wound edges coapted well with absorbable sutures present and within the incision site.  There is no proximal edema erythema drainage noted with good range of motion of the first MPJ with the right hallux in a slightly dorsiflexed position     Assessment:  Overall doing very well after having osteotomy first metatarsal right with fixation in place with mild elevation of the right hallux at the MPJ with what appears to be functioning flexor tendon     Plan:  H&P reviewed x-ray and at this point I am going to send her to physical therapy to see if they can strengthen and better position the right hallux.  Overall range of motion is good she is healing well and I did dispense a surgical shoe that she can slowly start wearing over the next couple weeks.  She will reappoint to Dr. Loreta Ave 3 weeks or earlier if necessary and I did show her how to plantarflex the hallux and utilize a dressing with it and I also want her to try to strengthen the flexor tendon right big toe  X-rays indicate osteotomy is healing well screw pin fixation in place joint congruence elevation of the hallux noted on lateral view

## 2022-07-20 DIAGNOSIS — H9193 Unspecified hearing loss, bilateral: Secondary | ICD-10-CM | POA: Diagnosis not present

## 2022-07-20 DIAGNOSIS — H539 Unspecified visual disturbance: Secondary | ICD-10-CM | POA: Diagnosis not present

## 2022-07-21 DIAGNOSIS — Z135 Encounter for screening for eye and ear disorders: Secondary | ICD-10-CM | POA: Diagnosis not present

## 2022-07-21 DIAGNOSIS — H5203 Hypermetropia, bilateral: Secondary | ICD-10-CM | POA: Diagnosis not present

## 2022-07-21 DIAGNOSIS — H52223 Regular astigmatism, bilateral: Secondary | ICD-10-CM | POA: Diagnosis not present

## 2022-07-21 DIAGNOSIS — H524 Presbyopia: Secondary | ICD-10-CM | POA: Diagnosis not present

## 2022-07-22 NOTE — Therapy (Unsigned)
OUTPATIENT PHYSICAL THERAPY LOWER EXTREMITY EVALUATION   Patient Name: Valerie Spence MRN: [id number] DOB:July 28, 1960, 62 y.o., female Today's Date: 07/23/2022  END OF SESSION:  PT End of Session - 07/23/22 1347     Visit Number 1    Number of Visits 16    Date for PT Re-Evaluation 10/15/22    PT Start Time 1348    PT Stop Time 1426    PT Time Calculation (min) 38 min    Activity Tolerance Patient tolerated treatment well    Behavior During Therapy [facility] for tasks assessed/performed             History reviewed. No pertinent past medical history. History reviewed. No pertinent surgical history. Patient Active Problem List   Diagnosis Date Noted   Patellofemoral pain syndrome of both knees 08/30/2020   Menopausal syndrome 12/26/2018    PCP: Koren Shiver, DO  REFERRING PROVIDER: Lenn Sink, DPM  REFERRING DIAG: [phone] (ICD-10-CM) - Post-operative state  THERAPY DIAG:  Muscle weakness (generalized)  Decreased range of motion of foot, right  Difficulty in walking, not elsewhere classified  Rationale for Evaluation and Treatment: Rehabilitation  ONSET DATE: 06/17/22 RIGHT FOOT SURGICAL CORRECTION OF BUNION ([place]) WITH HARDWARE FIXATION   SUBJECTIVE:   SUBJECTIVE STATEMENT: No pain currently. States she was in a boot since her last MD visit and is now in a walking shoe States she had a hair that had to be removed from her second toe on t 7/4 States she is back to work Monday but will be at home for the first 2 weeks.  Reports her big toe does not touch the ground and doesn't stay down. Needs to be able to walk  PERTINENT HISTORY: Multiple hammer toe surgeries (2 pins in left foot (2nd and 3rd digits) 3 pins in right foot 1-3 toes) PAIN:  Are you having pain? No current pain  PRECAUTIONS: None  WEIGHT BEARING RESTRICTIONS: Yes WB IN SHOE FOR 3 WEEKS-wean from shoe starting 08/04/22  FALLS:  Has patient fallen in last 6 months?  No  OCCUPATION: case Production designer, theatre/television/film at [employer]   PLOF: Independent  PATIENT GOALS: to be able to bring toe down   OBJECTIVE:    PATIENT SURVEYS:  FOTO 4%   COGNITION: Overall cognitive status: Within functional limits for tasks assessed     SENSATION: WFL  EDEMA:  At metatarsal heads  21.5 cm L 22.25 cm R     POSTURE: B first digits in valgus and right first digit resting in extension   PALPATION: No tenderness to palpation throughout foot/toe   LE Measurements Lower Extremity Right EVAL Left EVAL   A/PROM MMT A/PROM MMT  Hip Flexion      Hip Extension      Hip Abduction      Hip Adduction      Hip Internal rotation      Hip External rotation      Knee Flexion      Knee Extension      Ankle Dorsiflexion  3+  4+  Ankle Plantarflexion      Ankle Inversion  3+  4+  Ankle Eversion  3+  4+   (Blank rows = not tested) * pain   LOWER EXTREMITY SPECIAL TESTS:  Neg homans' sign    GAIT: Distance walked: 25 ft in clinic Assistive device utilized: None Level of assistance: Modified independence Comments: surgical shoe on right    TODAY'S TREATMENT:  DATE:   07/23/2022  Therapeutic Exercise:  Aerobic: Supine: Prone:  Seated: toe flexion/toe splaying/toe extension active and passive 10 minutes  Standing: Neuromuscular Re-education: Manual Therapy: Therapeutic Activity: Self Care: Trigger Point Dry Needling:  Modalities:    PATIENT EDUCATION:  Education details: on current presentation, on HEP, on clinical outcomes score and POC, on toe spacer, on wider toe box shoes , on compression socks Person educated: Patient Education method: Explanation, Demonstration, and Handouts Education comprehension: verbalized understanding   HOME EXERCISE PROGRAM: [id number]  ASSESSMENT:  CLINICAL IMPRESSION: Patient presents to clinic after  undergoing right hallex osteotomy valgus correction. Patient with history of multiple toe surgeries on both large extremities. Patient demonstrates weakness, range of motion deficits and overall functional deficits due to recent surgery. Patient greatly benefit from skilled PT to improve overall function and quality of life. Session focused on education regards to post op recovery, managing swelling, improper footwear moving forward after surgical shoe is discharged.  OBJECTIVE IMPAIRMENTS: Abnormal gait, decreased activity tolerance, decreased balance, decreased endurance, decreased mobility, difficulty walking, decreased ROM, decreased strength, increased edema, improper body mechanics, and pain.   ACTIVITY LIMITATIONS: sitting, standing, squatting, sleeping, stairs, transfers, bed mobility, bathing, and locomotion level  PARTICIPATION LIMITATIONS: meal prep, cleaning, laundry, driving, community activity, occupation, and yard work  PERSONAL FACTORS: Age, Fitness, Time since onset of injury/illness/exacerbation, and 1 comorbidity: multiple foot surgeries  are also affecting patient's functional outcome.   REHAB POTENTIAL: Good  CLINICAL DECISION MAKING: Stable/uncomplicated  EVALUATION COMPLEXITY: Low   GOALS: Goals reviewed with patient? yes  SHORT TERM GOALS: Target date: 09/03/2022   Patient will be independent in self management strategies to improve quality of life and functional outcomes. Baseline: New Program Goal status: INITIAL  2.  Patient will report at least 50% improvement in overall symptoms and/or function to demonstrate improved functional mobility Baseline: 0% better Goal status: INITIAL  3.  Patient will demonstrate no swelling in right foot to demonstrate improved healing Baseline: Swelling Goal status: INITIAL    LONG TERM GOALS: Target date: 10/15/2022    Patient will report at least 75% improvement in overall symptoms and/or function to demonstrate improved  functional mobility Baseline: 0% better Goal status: INITIAL  2.  Patient will improve score on FOTO outcomes measure to projected score to demonstrate overall improved function and QOL Baseline: see above Goal status: INITIAL  3.  Patient will be able to ambulate with normal walking shoe (per MD approval) without pain or significant limitations to return to prior level of function Baseline: in surgical  shoe Goal status: INITIAL    PLAN:  PT FREQUENCY: 1-2x/week for 16 visits  PT DURATION: 12 weeks  PLANNED INTERVENTIONS: Therapeutic exercises, Therapeutic activity, Neuromuscular re-education, Balance training, Gait training, Patient/Family education, Self Care, Joint mobilization, Joint manipulation, Stair training, Vestibular training, Canalith repositioning, Orthotic/Fit training, Prosthetic training, DME instructions, Aquatic Therapy, Dry Needling, Electrical stimulation, Spinal manipulation, Spinal mobilization, Cryotherapy, Moist heat, Taping, Traction, Ultrasound, Ionotophoresis 4mg /ml Dexamethasone, Manual therapy, and Re-evaluation.   PLAN FOR NEXT SESSION: ROM, strengthening, - 2 weeks WB out of boot - balance/weight shifts   5:15 PM, 07/23/22 Tereasa Coop, DPT Physical Therapy with [facility]

## 2022-07-23 ENCOUNTER — Ambulatory Visit (INDEPENDENT_AMBULATORY_CARE_PROVIDER_SITE_OTHER): Payer: 59 | Admitting: Physical Therapy

## 2022-07-23 ENCOUNTER — Encounter: Payer: Self-pay | Admitting: Physical Therapy

## 2022-07-23 DIAGNOSIS — R262 Difficulty in walking, not elsewhere classified: Secondary | ICD-10-CM | POA: Diagnosis not present

## 2022-07-23 DIAGNOSIS — M6281 Muscle weakness (generalized): Secondary | ICD-10-CM

## 2022-07-23 DIAGNOSIS — M25674 Stiffness of right foot, not elsewhere classified: Secondary | ICD-10-CM | POA: Diagnosis not present

## 2022-07-27 ENCOUNTER — Encounter: Payer: Self-pay | Admitting: Physical Therapy

## 2022-07-27 ENCOUNTER — Ambulatory Visit (INDEPENDENT_AMBULATORY_CARE_PROVIDER_SITE_OTHER): Payer: 59 | Admitting: Physical Therapy

## 2022-07-27 DIAGNOSIS — M6281 Muscle weakness (generalized): Secondary | ICD-10-CM | POA: Diagnosis not present

## 2022-07-27 DIAGNOSIS — R262 Difficulty in walking, not elsewhere classified: Secondary | ICD-10-CM | POA: Diagnosis not present

## 2022-07-27 DIAGNOSIS — M25674 Stiffness of right foot, not elsewhere classified: Secondary | ICD-10-CM | POA: Diagnosis not present

## 2022-07-27 NOTE — Therapy (Addendum)
OUTPATIENT PHYSICAL THERAPY LOWER EXTREMITY TREATMENT   Patient Name: Valerie Spence MRN: [id number] DOB:06-14-1960, 62 y.o., female Today's Date: 07/27/2022  END OF SESSION:  PT End of Session - 08/04/22 1212     Visit Number 2    Number of Visits 16    Date for PT Re-Evaluation 10/15/22    PT Start Time 0849    PT Stop Time 0930    PT Time Calculation (min) 41 min    Activity Tolerance Patient tolerated treatment well    Behavior During Therapy [facility] for tasks assessed/performed              History reviewed. No pertinent past medical history. History reviewed. No pertinent surgical history. Patient Active Problem List   Diagnosis Date Noted   Patellofemoral pain syndrome of both knees 08/30/2020   Menopausal syndrome 12/26/2018    PCP: Koren Shiver, DO  REFERRING PROVIDER: Lenn Sink, DPM  REFERRING DIAG: [phone] (ICD-10-CM) - Post-operative state  THERAPY DIAG:  Muscle weakness (generalized)  Difficulty in walking, not elsewhere classified  Decreased range of motion of foot, right  Rationale for Evaluation and Treatment: Rehabilitation  ONSET DATE: 06/17/22 RIGHT FOOT SURGICAL CORRECTION OF BUNION ([place]) WITH HARDWARE FIXATION   SUBJECTIVE:   SUBJECTIVE STATEMENT: Pt with no new complaints. She states no pain. Has been wearing surgical shoe, and today is wearing ankle compression sleeve. Has mild swelling in forefoot.  Eval: No pain currently. States she was in a boot since her last MD visit and is now in a walking shoe States she had a hair that had to be removed from her second toe on t 7/4 States she is back to work Monday but will be at home for the first 2 weeks.  Reports her big toe does not touch the ground and doesn't stay down. Needs to be able to walk  PERTINENT HISTORY: Multiple hammer toe surgeries (2 pins in left foot (2nd and 3rd digits) 3 pins in right foot 1-3 toes) PAIN:  Are you having pain? No current  pain  PRECAUTIONS: None  WEIGHT BEARING RESTRICTIONS: Yes WB IN SHOE FOR 3 WEEKS-wean from shoe starting 08/04/22  FALLS:  Has patient fallen in last 6 months? No  OCCUPATION: case Production designer, theatre/television/film at [employer]   PLOF: Independent  PATIENT GOALS: to be able to bring toe down   OBJECTIVE:    PATIENT SURVEYS:  FOTO 4%   COGNITION: Overall cognitive status: Within functional limits for tasks assessed     SENSATION: WFL  EDEMA:  At metatarsal heads  21.5 cm L 22.25 cm R     POSTURE: B first digits in valgus and right first digit resting in extension   PALPATION: No tenderness to palpation throughout foot/toe   LE Measurements Lower Extremity Right EVAL Left EVAL   A/PROM MMT A/PROM MMT  Hip Flexion      Hip Extension      Hip Abduction      Hip Adduction      Hip Internal rotation      Hip External rotation      Knee Flexion      Knee Extension      Ankle Dorsiflexion  3+  4+  Ankle Plantarflexion      Ankle Inversion  3+  4+  Ankle Eversion  3+  4+   (Blank rows = not tested) * pain   LOWER EXTREMITY SPECIAL TESTS:  Neg homans' sign    GAIT: Distance walked:  25 ft in clinic Assistive device utilized: None Level of assistance: Modified independence Comments: surgical shoe on right    TODAY'S TREATMENT:                                                                                                                              DATE:    07/27/2022  Therapeutic Exercise:  Aerobic: Supine: Ankle AROM: PF/DF and Inv/Ev x 15 ea with cuing for optimal mechanics.  Prone:  Seated:  toe flexion/press into floor ( on folded washcloth) 2 x 10;     Heel raises (light) for ROM  x15  Standing:  L/R weight shifts with practice for keeping toes on floor.  Neuromuscular Re-education: Manual Therapy: Light Stm at R forefoot for swelling Therapeutic Activity: Self Care: Trigger Point Dry Needling:  Modalities:    PATIENT EDUCATION:  Education details: reviewed  HEP, reviewed ways to reduce swelling, with elevation, ice, and trial for compression sock vs ankle brace.  Person educated: Patient Education method: Explanation, Demonstration, and Handouts Education comprehension: verbalized understanding   HOME EXERCISE PROGRAM: [id number]  ASSESSMENT:  CLINICAL IMPRESSION: Focus for ankle and toe ROM today. Practice for toe flexion ROM and light activation of FHL to assist great toe to floor in seated and standing positions. Discussed swelling at forefoot and trying compression sock to improve this. She will bring sneakers with her in next few visits when she is able to progress out of surgical shoe.   Eval: Patient presents to clinic after undergoing right hallex osteotomy valgus correction. Patient with history of multiple toe surgeries on both large extremities. Patient demonstrates weakness, range of motion deficits and overall functional deficits due to recent surgery. Patient greatly benefit from skilled PT to improve overall function and quality of life. Session focused on education regards to post op recovery, managing swelling, improper footwear moving forward after surgical shoe is discharged.  OBJECTIVE IMPAIRMENTS: Abnormal gait, decreased activity tolerance, decreased balance, decreased endurance, decreased mobility, difficulty walking, decreased ROM, decreased strength, increased edema, improper body mechanics, and pain.   ACTIVITY LIMITATIONS: sitting, standing, squatting, sleeping, stairs, transfers, bed mobility, bathing, and locomotion level  PARTICIPATION LIMITATIONS: meal prep, cleaning, laundry, driving, community activity, occupation, and yard work  PERSONAL FACTORS: Age, Fitness, Time since onset of injury/illness/exacerbation, and 1 comorbidity: multiple foot surgeries  are also affecting patient's functional outcome.   REHAB POTENTIAL: Good  CLINICAL DECISION MAKING: Stable/uncomplicated  EVALUATION COMPLEXITY:  Low   GOALS: Goals reviewed with patient? yes  SHORT TERM GOALS: Target date: 09/03/2022   Patient will be independent in self management strategies to improve quality of life and functional outcomes. Baseline: New Program Goal status: INITIAL  2.  Patient will report at least 50% improvement in overall symptoms and/or function to demonstrate improved functional mobility Baseline: 0% better Goal status: INITIAL  3.  Patient will demonstrate no swelling in right foot to demonstrate improved healing Baseline: Swelling Goal status: INITIAL  LONG TERM GOALS: Target date: 10/15/2022    Patient will report at least 75% improvement in overall symptoms and/or function to demonstrate improved functional mobility Baseline: 0% better Goal status: INITIAL  2.  Patient will improve score on FOTO outcomes measure to projected score to demonstrate overall improved function and QOL Baseline: see above Goal status: INITIAL  3.  Patient will be able to ambulate with normal walking shoe (per MD approval) without pain or significant limitations to return to prior level of function Baseline: in surgical  shoe Goal status: INITIAL   PLAN:  PT FREQUENCY: 1-2x/week for 16 visits  PT DURATION: 12 weeks  PLANNED INTERVENTIONS: Therapeutic exercises, Therapeutic activity, Neuromuscular re-education, Balance training, Gait training, Patient/Family education, Self Care, Joint mobilization, Joint manipulation, Stair training, Vestibular training, Canalith repositioning, Orthotic/Fit training, Prosthetic training, DME instructions, Aquatic Therapy, Dry Needling, Electrical stimulation, Spinal manipulation, Spinal mobilization, Cryotherapy, Moist heat, Taping, Traction, Ultrasound, Ionotophoresis 4mg /ml Dexamethasone, Manual therapy, and Re-evaluation.   PLAN FOR NEXT SESSION: ROM, strengthening, - 2 weeks WB out of boot - balance/weight shifts   Sedalia Muta, PT, DPT 12:12 PM  08/04/22

## 2022-08-04 ENCOUNTER — Encounter: Payer: Self-pay | Admitting: Physical Therapy

## 2022-08-05 ENCOUNTER — Ambulatory Visit: Payer: 59 | Admitting: Physical Therapy

## 2022-08-05 ENCOUNTER — Encounter: Payer: Self-pay | Admitting: Physical Therapy

## 2022-08-05 DIAGNOSIS — M25674 Stiffness of right foot, not elsewhere classified: Secondary | ICD-10-CM | POA: Diagnosis not present

## 2022-08-05 DIAGNOSIS — M6281 Muscle weakness (generalized): Secondary | ICD-10-CM | POA: Diagnosis not present

## 2022-08-05 DIAGNOSIS — R262 Difficulty in walking, not elsewhere classified: Secondary | ICD-10-CM

## 2022-08-05 NOTE — Therapy (Addendum)
OUTPATIENT PHYSICAL THERAPY LOWER EXTREMITY TREATMENT PHYSICAL THERAPY DISCHARGE SUMMARY  Visits from Start of Care: 3  Current functional level related to goals / functional outcomes: Not able to assess   Remaining deficits: Not able to assess   Education / Equipment: Not able to assess  Patient agrees to discharge. Patient goals were  Not able to assess . Patient is being discharged due to being pleased with the current functional level.  2:18 PM, 10/19/22 Tereasa Coop, DPT Physical Therapy with [place]   Patient Name: Valerie Spence MRN: [id number] DOB:1960-12-14, 62 y.o., female Today's Date: 07/27/2022  END OF SESSION:  PT End of Session - 08/05/22 1433     Visit Number 3    Number of Visits 16    Date for PT Re-Evaluation 10/15/22    PT Start Time 1434    PT Stop Time 1513    PT Time Calculation (min) 39 min    Activity Tolerance Patient tolerated treatment well    Behavior During Therapy [facility] for tasks assessed/performed              History reviewed. No pertinent past medical history. History reviewed. No pertinent surgical history. Patient Active Problem List   Diagnosis Date Noted   Patellofemoral pain syndrome of both knees 08/30/2020   Menopausal syndrome 12/26/2018    PCP: Koren Shiver, DO  REFERRING PROVIDER: Lenn Sink, DPM  REFERRING DIAG: [phone] (ICD-10-CM) - Post-operative state  THERAPY DIAG:  Muscle weakness (generalized)  Difficulty in walking, not elsewhere classified  Decreased range of motion of foot, right  Rationale for Evaluation and Treatment: Rehabilitation  ONSET DATE: 06/17/22 RIGHT FOOT SURGICAL CORRECTION OF BUNION Minta Balsam) WITH HARDWARE FIXATION   SUBJECTIVE:   SUBJECTIVE STATEMENT: 08/05/2022 States that things are going well, seeing MD tomorrow for 3 week f/u. States she feels like she is making progress. States she feels the muscle working goes back to work next week.   Eval: No pain  currently. States she was in a boot since her last MD visit and is now in a walking shoe States she had a hair that had to be removed from her second toe on t 7/4 States she is back to work Monday but will be at home for the first 2 weeks.  Reports her big toe does not touch the ground and doesn't stay down. Needs to be able to walk  PERTINENT HISTORY: Multiple hammer toe surgeries (2 pins in left foot (2nd and 3rd digits) 3 pins in right foot 1-3 toes) PAIN:  Are you having pain? No current pain  PRECAUTIONS: None  WEIGHT BEARING RESTRICTIONS: Yes WB IN SHOE FOR 3 WEEKS-wean from shoe starting 08/04/22  FALLS:  Has patient fallen in last 6 months? No  OCCUPATION: case Production designer, theatre/television/film at [employer]   PLOF: Independent  PATIENT GOALS: to be able to bring toe down   OBJECTIVE:    PATIENT SURVEYS:  FOTO 4%   COGNITION: Overall cognitive status: Within functional limits for tasks assessed     SENSATION: WFL  EDEMA:  At metatarsal heads  21.5 cm L 22.25 cm R     POSTURE: B first digits in valgus and right first digit resting in extension   PALPATION: No tenderness to palpation throughout foot/toe   LE Measurements Lower Extremity Right EVAL Left EVAL   A/PROM MMT A/PROM MMT  Hip Flexion      Hip Extension      Hip Abduction  Hip Adduction      Hip Internal rotation      Hip External rotation      Knee Flexion      Knee Extension      Ankle Dorsiflexion  3+  4+  Ankle Plantarflexion      Ankle Inversion  3+  4+  Ankle Eversion  3+  4+   (Blank rows = not tested) * pain   LOWER EXTREMITY SPECIAL TESTS:  Neg homans' sign    GAIT: Distance walked: 25 ft in clinic Assistive device utilized: None Level of assistance: Modified independence Comments: surgical shoe on right    TODAY'S TREATMENT:                                                                                                                              DATE:    07/27/2022  Therapeutic  Exercise:  Aerobic: Supine:   Prone:  Seated:  toe flexion/press into floor ( on folded washcloth) 2 x 10;     Heel raises  2x15, DF 2x15 B, towel scrunches 3 minutes total B, towel Inv/ever R 3 minutes, PROM of toes and foot 3 minutes R  Standing:  L/R weight shifts with practice for keeping toes on floor. Lateral and forward 6 minutes, on blue foam with feet together 1 minute holds - focus on standing still for foot intrinsic strengthening.  Neuromuscular Re-education: Manual Therapy:   Therapeutic Activity: Self Care: Trigger Point Dry Needling:  Modalities:    PATIENT EDUCATION:  Education details: reviewed HEP, on basic wound care for surgical site Person educated: Patient Education method: Explanation, Demonstration, and Handouts Education comprehension: verbalized understanding   HOME EXERCISE PROGRAM: [id number]  ASSESSMENT:  CLINICAL IMPRESSION: Improved toe mobility noted on this date, continued swelling along mid and forefoot. Discussed weaning from boot once MD approves. Progressed home exercises. No pain noted during or after session. Limited inversion noted with towel movements, discussed correcting for hip compensations. Will continue with current POC as tolerated.   Eval: Patient presents to clinic after undergoing right hallex osteotomy valgus correction. Patient with history of multiple toe surgeries on both large extremities. Patient demonstrates weakness, range of motion deficits and overall functional deficits due to recent surgery. Patient greatly benefit from skilled PT to improve overall function and quality of life. Session focused on education regards to post op recovery, managing swelling, improper footwear moving forward after surgical shoe is discharged.  OBJECTIVE IMPAIRMENTS: Abnormal gait, decreased activity tolerance, decreased balance, decreased endurance, decreased mobility, difficulty walking, decreased ROM, decreased strength, increased edema,  improper body mechanics, and pain.   ACTIVITY LIMITATIONS: sitting, standing, squatting, sleeping, stairs, transfers, bed mobility, bathing, and locomotion level  PARTICIPATION LIMITATIONS: meal prep, cleaning, laundry, driving, community activity, occupation, and yard work  PERSONAL FACTORS: Age, Fitness, Time since onset of injury/illness/exacerbation, and 1 comorbidity: multiple foot surgeries  are also affecting patient's functional outcome.   REHAB POTENTIAL: Good  CLINICAL  DECISION MAKING: Stable/uncomplicated  EVALUATION COMPLEXITY: Low   GOALS: Goals reviewed with patient? yes  SHORT TERM GOALS: Target date: 09/03/2022   Patient will be independent in self management strategies to improve quality of life and functional outcomes. Baseline: New Program Goal status: INITIAL  2.  Patient will report at least 50% improvement in overall symptoms and/or function to demonstrate improved functional mobility Baseline: 0% better Goal status: INITIAL  3.  Patient will demonstrate no swelling in right foot to demonstrate improved healing Baseline: Swelling Goal status: INITIAL    LONG TERM GOALS: Target date: 10/15/2022    Patient will report at least 75% improvement in overall symptoms and/or function to demonstrate improved functional mobility Baseline: 0% better Goal status: INITIAL  2.  Patient will improve score on FOTO outcomes measure to projected score to demonstrate overall improved function and QOL Baseline: see above Goal status: INITIAL  3.  Patient will be able to ambulate with normal walking shoe (per MD approval) without pain or significant limitations to return to prior level of function Baseline: in surgical  shoe Goal status: INITIAL   PLAN:  PT FREQUENCY: 1-2x/week for 16 visits  PT DURATION: 12 weeks  PLANNED INTERVENTIONS: Therapeutic exercises, Therapeutic activity, Neuromuscular re-education, Balance training, Gait training, Patient/Family  education, Self Care, Joint mobilization, Joint manipulation, Stair training, Vestibular training, Canalith repositioning, Orthotic/Fit training, Prosthetic training, DME instructions, Aquatic Therapy, Dry Needling, Electrical stimulation, Spinal manipulation, Spinal mobilization, Cryotherapy, Moist heat, Taping, Traction, Ultrasound, Ionotophoresis 4mg /ml Dexamethasone, Manual therapy, and Re-evaluation.   PLAN FOR NEXT SESSION: ROM, strengthening, - 2 weeks WB out of boot - balance/weight shifts   3:13 PM, 08/05/22 Tereasa Coop, DPT Physical Therapy with [facility]

## 2022-08-06 ENCOUNTER — Ambulatory Visit (INDEPENDENT_AMBULATORY_CARE_PROVIDER_SITE_OTHER): Payer: 59 | Admitting: Podiatry

## 2022-08-06 ENCOUNTER — Ambulatory Visit (INDEPENDENT_AMBULATORY_CARE_PROVIDER_SITE_OTHER): Payer: 59

## 2022-08-06 ENCOUNTER — Encounter: Payer: Self-pay | Admitting: Podiatry

## 2022-08-06 VITALS — BP 106/57 | HR 50

## 2022-08-06 DIAGNOSIS — M21619 Bunion of unspecified foot: Secondary | ICD-10-CM

## 2022-08-06 DIAGNOSIS — L731 Pseudofolliculitis barbae: Secondary | ICD-10-CM

## 2022-08-06 DIAGNOSIS — M21611 Bunion of right foot: Secondary | ICD-10-CM | POA: Diagnosis not present

## 2022-08-06 NOTE — Progress Notes (Signed)
Subjective: Chief Complaint  Patient presents with   Routine Post Op    "Doing good.  The surgery is going well and physical therapy is too."    62 year old female presents the office for above concerns.  States that she is doing well.  She been doing physical therapy but has consistently to be going.  No significant pain.  Still some mild swelling.  Also concerned with ingrown hair on the second toe may still be present.  She denies any drainage or pus.  Objective: AAO x3, NAD DP/PT pulses palpable bilaterally, CRT less than 3 seconds On the right foot incision well coapted and eschar is formed.  There is mild edema still present there is no erythema or warmth and clinically appears to be improving.  Toe is rectus.  No pain on exam.  On the second toe small firm nodule present there is no drainage or pus or any fluctuation crepitation.  No clinical signs of infection.  No pain with calf compression, swelling, warmth, erythema  Assessment: Status post right foot bunionectomy; ingrown hair  Plan: -All treatment options discussed with the patient including all alternatives, risks, complications.  -X-rays obtained and reviewed.  3 views of the foot were obtained.  No subacute fracture.  Increased consolidation noted across the osteotomy site. -Discussion gradual transition to regular shoe as tolerated increase activity as tolerated.  Continue physical therapy we will get hide home program that she can do on her own at home.  Continue ice, elevate as well as compression to help with residual edema. -Does not appear to be any infection from ingrown hair.  Discussed antibiotic cream with primary be well on the area daily.  We both agreed to hold off on further intervention/procedure for this right now.   Return in about 4 weeks (around 09/03/2022).  X-ray next appointment  Vivi Barrack DPM

## 2022-08-10 ENCOUNTER — Telehealth: Payer: Self-pay | Admitting: Family Medicine

## 2022-08-10 ENCOUNTER — Encounter: Payer: 59 | Admitting: Physical Therapy

## 2022-08-10 NOTE — Telephone Encounter (Signed)
Pt would like a call back from [facility] for PT.

## 2022-08-12 ENCOUNTER — Encounter: Payer: 59 | Admitting: Physical Therapy

## 2022-08-17 ENCOUNTER — Encounter: Payer: 59 | Admitting: Physical Therapy

## 2022-08-19 ENCOUNTER — Encounter: Payer: 59 | Admitting: Physical Therapy

## 2022-08-24 ENCOUNTER — Encounter: Payer: 59 | Admitting: Physical Therapy

## 2022-08-26 ENCOUNTER — Encounter: Payer: 59 | Admitting: Physical Therapy

## 2022-09-07 ENCOUNTER — Ambulatory Visit (INDEPENDENT_AMBULATORY_CARE_PROVIDER_SITE_OTHER): Payer: 59

## 2022-09-07 ENCOUNTER — Ambulatory Visit: Payer: 59 | Admitting: Podiatry

## 2022-09-07 DIAGNOSIS — Z9889 Other specified postprocedural states: Secondary | ICD-10-CM

## 2022-09-07 DIAGNOSIS — M21611 Bunion of right foot: Secondary | ICD-10-CM

## 2022-09-07 DIAGNOSIS — L731 Pseudofolliculitis barbae: Secondary | ICD-10-CM

## 2022-09-07 DIAGNOSIS — M21619 Bunion of unspecified foot: Secondary | ICD-10-CM

## 2022-09-07 NOTE — Progress Notes (Unsigned)
 Subjective: Chief Complaint  Patient presents with   Routine Post Op    "Doing good.  The surgery is going well and physical therapy is too."    62 year old female presents the office for above concerns.  States that she is doing well.  She been doing physical therapy but has consistently to be going.  No significant pain.  Still some mild swelling.  Also concerned with ingrown hair on the second toe may still be present.  She denies any drainage or pus.  Objective: AAO x3, NAD DP/PT pulses palpable bilaterally, CRT less than 3 seconds On the right foot incision well coapted and eschar is formed.  There is mild edema still present there is no erythema or warmth and clinically appears to be improving.  Toe is rectus.  No pain on exam.  On the second toe small firm nodule present there is no drainage or pus or any fluctuation crepitation.  No clinical signs of infection.  No pain with calf compression, swelling, warmth, erythema  Assessment: Status post right foot bunionectomy; ingrown hair  Plan: -All treatment options discussed with the patient including all alternatives, risks, complications.  -X-rays obtained and reviewed.  3 views of the foot were obtained.  No subacute fracture.  Increased consolidation noted across the osteotomy site. -Discussion gradual transition to regular shoe as tolerated increase activity as tolerated.  Continue physical therapy we will get hide home program that she can do on her own at home.  Continue ice, elevate as well as compression to help with residual edema. -Does not appear to be any infection from ingrown hair.  Discussed antibiotic cream with primary be well on the area daily.  We both agreed to hold off on further intervention/procedure for this right now.   Return in about 4 weeks (around 09/03/2022).  X-ray next appointment  Vivi Barrack DPM

## 2022-09-15 ENCOUNTER — Other Ambulatory Visit (HOSPITAL_COMMUNITY): Payer: Self-pay

## 2022-09-15 DIAGNOSIS — Z01419 Encounter for gynecological examination (general) (routine) without abnormal findings: Secondary | ICD-10-CM | POA: Diagnosis not present

## 2022-09-15 DIAGNOSIS — Z1231 Encounter for screening mammogram for malignant neoplasm of breast: Secondary | ICD-10-CM | POA: Diagnosis not present

## 2022-09-15 DIAGNOSIS — R300 Dysuria: Secondary | ICD-10-CM | POA: Diagnosis not present

## 2022-09-15 MED ORDER — ESTRADIOL 0.1 MG/GM VA CREA
TOPICAL_CREAM | VAGINAL | 4 refills | Status: AC
  Filled 2022-09-15: qty 42.5, 90d supply, fill #0
  Filled 2022-11-02 – 2022-12-03 (×3): qty 42.5, 90d supply, fill #1
  Filled 2023-06-11: qty 42.5, 90d supply, fill #2

## 2022-09-15 MED ORDER — PROGESTERONE MICRONIZED 100 MG PO CAPS
100.0000 mg | ORAL_CAPSULE | Freq: Every day | ORAL | 3 refills | Status: AC
  Filled 2022-09-15: qty 90, 90d supply, fill #0
  Filled 2022-12-12: qty 90, 90d supply, fill #1
  Filled 2023-03-16: qty 90, 90d supply, fill #2
  Filled 2023-06-11: qty 90, 90d supply, fill #3

## 2022-09-15 MED ORDER — ESTRADIOL 0.5 MG PO TABS
0.5000 mg | ORAL_TABLET | Freq: Every day | ORAL | 4 refills | Status: DC
  Filled 2022-09-15: qty 95, 95d supply, fill #0
  Filled 2022-11-02: qty 90, 90d supply, fill #0
  Filled 2023-01-29: qty 90, 90d supply, fill #1
  Filled 2023-04-26: qty 90, 90d supply, fill #2
  Filled 2023-08-05: qty 90, 90d supply, fill #3

## 2022-09-17 ENCOUNTER — Other Ambulatory Visit (HOSPITAL_COMMUNITY): Payer: Self-pay

## 2022-09-18 ENCOUNTER — Other Ambulatory Visit (HOSPITAL_COMMUNITY): Payer: Self-pay

## 2022-09-18 DIAGNOSIS — F4321 Adjustment disorder with depressed mood: Secondary | ICD-10-CM | POA: Diagnosis not present

## 2022-09-18 MED ORDER — TRAZODONE HCL 50 MG PO TABS
50.0000 mg | ORAL_TABLET | Freq: Every day | ORAL | 3 refills | Status: AC
  Filled 2022-09-18: qty 30, 30d supply, fill #0
  Filled 2023-04-26: qty 30, 30d supply, fill #1
  Filled 2023-08-05: qty 30, 30d supply, fill #2
  Filled 2023-09-09: qty 30, 30d supply, fill #3

## 2022-10-09 ENCOUNTER — Ambulatory Visit (INDEPENDENT_AMBULATORY_CARE_PROVIDER_SITE_OTHER): Payer: 59

## 2022-10-09 ENCOUNTER — Ambulatory Visit (INDEPENDENT_AMBULATORY_CARE_PROVIDER_SITE_OTHER): Payer: 59 | Admitting: Podiatry

## 2022-10-09 DIAGNOSIS — M79674 Pain in right toe(s): Secondary | ICD-10-CM

## 2022-10-09 DIAGNOSIS — M21611 Bunion of right foot: Secondary | ICD-10-CM

## 2022-10-09 DIAGNOSIS — L731 Pseudofolliculitis barbae: Secondary | ICD-10-CM

## 2022-10-09 NOTE — Progress Notes (Signed)
Subjective: 62 year old female presents the office for above concerns.  She states that she is doing well and she is happy with the outcome.  She is wearing regular shoes and doing normal activities and back at work full-time without any restrictions.  She is in the second toe is doing a little bit better as well.  She gets some soreness on the 2nd toe at times, but it is doing better she thinks.  No new injuries or concerns.  Objective: AAO x3, NAD DP/PT pulses palpable bilaterally, CRT less than 3 seconds On the right foot incision well coapted and a scar has formed.  Toe is rectus.  She has good range of motion of the first IPJ and dorsi flexion some mild restriction in plantarflexion.  There is no tenderness along palpation.  There is a hyperkeratotic lesion the dorsal IPJ of the second toe for the prior ingrown hair was at there is no edema, erythema, drainage or pus or any signs of infection today. No significant pain.  No pain with calf compression, swelling, warmth, erythema  Assessment: Status post right foot bunionectomy; ingrown hair  Plan: -All treatment options discussed with the patient including all alternatives, risks, complications.  -X-rays obtained and reviewed.  3 views of the foot were obtained.  No evidence of acute fracture.  Increased consolidation noted across the osteotomy site.  Hardware intact without any complicating factors.  -States doing much better from the bunion standpoint.  She is actively in regular shoes normal activities.  Discussed range of motion exercises to help long-term.  Continue supportive shoe gear.  Ice, elevation as well as compression of any residual edema. -We discussed excision of the skin lesion of the second toe but this seems to be doing better.  Will continue to monitor now.  Continue moisturizer once area.  Monitoring signs or symptoms of infection.  No follow-ups on file.  Vivi Barrack DPM

## 2022-10-12 DIAGNOSIS — F4323 Adjustment disorder with mixed anxiety and depressed mood: Secondary | ICD-10-CM | POA: Diagnosis not present

## 2022-11-02 ENCOUNTER — Other Ambulatory Visit (HOSPITAL_COMMUNITY): Payer: Self-pay

## 2022-11-10 DIAGNOSIS — Z1231 Encounter for screening mammogram for malignant neoplasm of breast: Secondary | ICD-10-CM | POA: Diagnosis not present

## 2022-11-10 DIAGNOSIS — Z1211 Encounter for screening for malignant neoplasm of colon: Secondary | ICD-10-CM | POA: Diagnosis not present

## 2022-11-10 DIAGNOSIS — R7303 Prediabetes: Secondary | ICD-10-CM | POA: Diagnosis not present

## 2022-11-10 DIAGNOSIS — Z13220 Encounter for screening for lipoid disorders: Secondary | ICD-10-CM | POA: Diagnosis not present

## 2022-11-10 DIAGNOSIS — Z0000 Encounter for general adult medical examination without abnormal findings: Secondary | ICD-10-CM | POA: Diagnosis not present

## 2022-11-10 DIAGNOSIS — Z124 Encounter for screening for malignant neoplasm of cervix: Secondary | ICD-10-CM | POA: Diagnosis not present

## 2022-11-10 DIAGNOSIS — F4321 Adjustment disorder with depressed mood: Secondary | ICD-10-CM | POA: Diagnosis not present

## 2022-11-12 DIAGNOSIS — F4323 Adjustment disorder with mixed anxiety and depressed mood: Secondary | ICD-10-CM | POA: Diagnosis not present

## 2022-11-13 ENCOUNTER — Other Ambulatory Visit (HOSPITAL_COMMUNITY): Payer: Self-pay

## 2022-11-13 DIAGNOSIS — H2511 Age-related nuclear cataract, right eye: Secondary | ICD-10-CM | POA: Diagnosis not present

## 2022-11-13 DIAGNOSIS — H2513 Age-related nuclear cataract, bilateral: Secondary | ICD-10-CM | POA: Diagnosis not present

## 2022-11-13 DIAGNOSIS — H25013 Cortical age-related cataract, bilateral: Secondary | ICD-10-CM | POA: Diagnosis not present

## 2022-11-13 DIAGNOSIS — H25043 Posterior subcapsular polar age-related cataract, bilateral: Secondary | ICD-10-CM | POA: Diagnosis not present

## 2022-11-13 MED ORDER — GATIFLOXACIN 0.5 % OP SOLN
1.0000 [drp] | Freq: Four times a day (QID) | OPHTHALMIC | 1 refills | Status: AC
  Filled 2022-11-13: qty 5, 25d supply, fill #0

## 2022-11-13 MED ORDER — PREDNISOLONE ACETATE 1 % OP SUSP
1.0000 [drp] | Freq: Four times a day (QID) | OPHTHALMIC | 1 refills | Status: AC
  Filled 2022-11-13: qty 5, 25d supply, fill #0

## 2022-11-13 MED ORDER — KETOROLAC TROMETHAMINE 0.5 % OP SOLN
1.0000 [drp] | Freq: Four times a day (QID) | OPHTHALMIC | 0 refills | Status: AC
  Filled 2022-11-13: qty 5, 25d supply, fill #0

## 2022-11-24 ENCOUNTER — Other Ambulatory Visit (HOSPITAL_COMMUNITY): Payer: Self-pay

## 2022-11-24 MED ORDER — PEG 3350-KCL-NA BICARB-NACL 420 G PO SOLR
ORAL | 0 refills | Status: AC
  Filled 2022-11-24: qty 4000, 1d supply, fill #0

## 2022-11-24 MED ORDER — BISACODYL 5 MG PO TBEC
DELAYED_RELEASE_TABLET | ORAL | 1 refills | Status: AC
  Filled 2022-11-24: qty 4, 1d supply, fill #0

## 2022-12-08 DIAGNOSIS — Z09 Encounter for follow-up examination after completed treatment for conditions other than malignant neoplasm: Secondary | ICD-10-CM | POA: Diagnosis not present

## 2022-12-08 DIAGNOSIS — K5289 Other specified noninfective gastroenteritis and colitis: Secondary | ICD-10-CM | POA: Diagnosis not present

## 2022-12-08 DIAGNOSIS — Z860101 Personal history of adenomatous and serrated colon polyps: Secondary | ICD-10-CM | POA: Diagnosis not present

## 2022-12-08 DIAGNOSIS — D123 Benign neoplasm of transverse colon: Secondary | ICD-10-CM | POA: Diagnosis not present

## 2022-12-28 DIAGNOSIS — H1131 Conjunctival hemorrhage, right eye: Secondary | ICD-10-CM | POA: Diagnosis not present

## 2023-01-18 ENCOUNTER — Encounter: Payer: Self-pay | Admitting: Podiatry

## 2023-01-18 ENCOUNTER — Ambulatory Visit (INDEPENDENT_AMBULATORY_CARE_PROVIDER_SITE_OTHER): Payer: 59 | Admitting: Podiatry

## 2023-01-18 DIAGNOSIS — L905 Scar conditions and fibrosis of skin: Secondary | ICD-10-CM | POA: Diagnosis not present

## 2023-01-18 DIAGNOSIS — L280 Lichen simplex chronicus: Secondary | ICD-10-CM | POA: Diagnosis not present

## 2023-01-18 DIAGNOSIS — M795 Residual foreign body in soft tissue: Secondary | ICD-10-CM | POA: Diagnosis not present

## 2023-01-18 DIAGNOSIS — M7989 Other specified soft tissue disorders: Secondary | ICD-10-CM | POA: Diagnosis not present

## 2023-01-24 NOTE — Progress Notes (Signed)
 Subjective: Chief Complaint  Patient presents with   Toe Pain    Follow up for second toe right foot     63 year old female presents the office for above concerns.  She states that her right ankle she still has a lesion where she had the ingrown hair previously which is causing tenderness is right along the IPJ.  She does not contact this area excised as it is still causing discomfort.  No swelling redness or drainage or any open lesions.  It is a pinpoint area where she gets the discomfort on the skin.    Objective: AAO x3, NAD DP/PT pulses palpable bilaterally, CRT less than 3 seconds Tenderness along the right second toe along the IPJ where there is a hyperkeratotic annular lesion present which is causing discomfort.  There is no open lesions there is no drainage or pus signs of infection.  No erythema or warmth. No pain with calf compression, swelling, warmth, erythema  Assessment: Skin lesion right second toe  Plan: -All treatment options discussed with the patient including all alternatives, risks, complications.  -Given ongoing nature presents we discussed excision of the lesion.  We discussed risks and she understands and consent was signed.  I cleaned the skin with alcohol and 3 mL of lidocaine, Marcaine plain was infiltrated in a digital block fashion.  Once anesthetized I utilized a 3 mm punch biopsy to excise the area of skin lesion, discomfort.  This was sent to pathology.  I irrigated with saline.  I utilized a single 5-0 nylon suture to reapproximate the wound.  Antibiotic ointment and dressing applied.  Postprocedure instructions discussed.  Monitoring signs or symptoms of infection.  Surgical shoe for now, offloading  Return in about 10 days (around 01/28/2023).  Valerie Spence DPM

## 2023-01-26 ENCOUNTER — Ambulatory Visit (INDEPENDENT_AMBULATORY_CARE_PROVIDER_SITE_OTHER): Payer: 59 | Admitting: Podiatry

## 2023-01-26 DIAGNOSIS — M7989 Other specified soft tissue disorders: Secondary | ICD-10-CM

## 2023-01-27 NOTE — Progress Notes (Signed)
 Subjective: No chief complaint on file.    64 year old female presents the office today for follow-up evaluation after going punch biopsy, excision skin lesion.  She did receive removal.  She is been doing well.  No significant pain.  She does not report any fever or chills.  No other concerns today.   Objective: AAO x3, NAD DP/PT pulses palpable bilaterally, CRT less than 3 seconds Single suture intact to the right second toe.  There is no sign of edema there is no erythema, drainage or pus or signs of infection.  There is no other lesion identified no other areas discomfort. No pain with calf compression, swelling, warmth, erythema  Assessment: Skin lesion right second toe  Plan: -All treatment options discussed with the patient including all alternatives, risks, complications.  -I remove the suture today without any complications.  Patient appears to be healing well.  I still keep a small amount of antibiotic ointment and dressing on the area daily.  She can wear regular shoe as tolerated. -Awaiting pathology report.  -Monitor for any clinical signs or symptoms of infection and directed to call the office immediately should any occur or go to the ER.  No follow-ups on file.  Valerie Spence DPM

## 2023-01-28 ENCOUNTER — Ambulatory Visit: Payer: 59 | Admitting: Podiatry

## 2023-01-29 ENCOUNTER — Other Ambulatory Visit: Payer: Self-pay | Admitting: Podiatry

## 2023-02-02 ENCOUNTER — Encounter: Payer: Self-pay | Admitting: Podiatry

## 2023-02-04 ENCOUNTER — Ambulatory Visit: Payer: 59 | Admitting: Podiatry

## 2023-02-11 DIAGNOSIS — H2511 Age-related nuclear cataract, right eye: Secondary | ICD-10-CM | POA: Diagnosis not present

## 2023-02-12 ENCOUNTER — Other Ambulatory Visit (HOSPITAL_COMMUNITY): Payer: Self-pay

## 2023-02-12 DIAGNOSIS — H25042 Posterior subcapsular polar age-related cataract, left eye: Secondary | ICD-10-CM | POA: Diagnosis not present

## 2023-02-12 DIAGNOSIS — H25012 Cortical age-related cataract, left eye: Secondary | ICD-10-CM | POA: Diagnosis not present

## 2023-02-12 DIAGNOSIS — H2512 Age-related nuclear cataract, left eye: Secondary | ICD-10-CM | POA: Diagnosis not present

## 2023-02-12 MED ORDER — KETOROLAC TROMETHAMINE 0.5 % OP SOLN
1.0000 [drp] | Freq: Four times a day (QID) | OPHTHALMIC | 1 refills | Status: AC
  Filled 2023-02-12: qty 5, 25d supply, fill #0

## 2023-02-12 MED ORDER — PREDNISOLONE ACETATE 1 % OP SUSP
1.0000 [drp] | Freq: Four times a day (QID) | OPHTHALMIC | 1 refills | Status: AC
  Filled 2023-02-12: qty 10, 50d supply, fill #0

## 2023-02-12 MED ORDER — GATIFLOXACIN 0.5 % OP SOLN
1.0000 [drp] | Freq: Four times a day (QID) | OPHTHALMIC | 1 refills | Status: AC
  Filled 2023-02-12: qty 5, 25d supply, fill #0

## 2023-02-25 DIAGNOSIS — H2512 Age-related nuclear cataract, left eye: Secondary | ICD-10-CM | POA: Diagnosis not present

## 2023-05-25 ENCOUNTER — Encounter: Attending: Family Medicine | Admitting: Dietician

## 2023-05-25 ENCOUNTER — Encounter: Payer: Self-pay | Admitting: Dietician

## 2023-05-25 VITALS — Ht 65.0 in | Wt 171.0 lb

## 2023-05-25 DIAGNOSIS — R7303 Prediabetes: Secondary | ICD-10-CM | POA: Insufficient documentation

## 2023-05-25 NOTE — Progress Notes (Signed)
 Medical Nutrition Therapy/ Employee visit  Appointment Start time:  [phone]  Appointment End time:  1201  Primary concerns today: recent weight gain; meal planning for a vegan diet Referral diagnosis: R73.03 Pre-diabetes Preferred learning style: no preference indicated (auditory, visual, hands on, no preference indicated) Learning readiness: ready (not ready, contemplating, ready, change in progress)  NUTRITION ASSESSMENT  Anthropometrics Weight: 171.0 lbs Height: 65 in   Clinical Medical Hx:  Medications: multivitamin, vit c, fiber supplement (tablets), biotin, progesterone , estrogen, calcium Labs: Per pt A1c is around 5.8 (previous A1c 6.2 in 2021) Notable Signs/Symptoms: none noted  Lifestyle & Dietary Hx  Pt states she does not try to stress over her weight. Pt states her children are also vegan, stating her husband is not. Pt states she likes noodles, stating she get hers from [facility].  Estimated daily fluid intake: 64 oz Supplements: vit C, multivitamin, fiber tablets, biotin Sleep: pretty good (7pm to 9 pm asleep and up at 6 am) Stress / self-care: 3 on a scale of 1-10; exercise (walking daily) Current average weekly physical activity: walking daily for 2 miles, and personal trainer twice a week for 60 minutes  24-Hr Dietary Recall First Meal: coffee, toast (dave's killer bread) peanut butter or "just egg" apple or orange Snack: cashews or peanuts Second Meal: noodles with vegetables and/or tofu Snack: cashews (unsalted raw) or almonds Third Meal: fried tofu, noodles and vegetables Snack: BBQ potato chips with tea Beverages: water,   NUTRITION DIAGNOSIS  NB-1.1 Food and nutrition-related knowledge deficit As related to recent life changes and prediabetes.  As evidenced by food recall and patient concerns.  NUTRITION INTERVENTION  Nutrition education (E-1) on the following topics:  Fruits & Vegetables: Aim to fill half your plate with a variety of fruits and  vegetables. They are rich in vitamins, minerals, and fiber, and can help reduce the risk of chronic diseases. Choose a colorful assortment of fruits and vegetables to ensure you get a wide range of nutrients. Grains and Starches: Make at least half of your grain choices whole grains, such as Malillany Kazlauskas rice, whole wheat bread, and oats. Whole grains provide fiber, which aids in digestion and healthy cholesterol levels. Aim for whole forms of starchy vegetables such as potatoes, sweet potatoes, beans, peas, and corn, which are fiber rich and provide many vitamins and minerals.  Protein: Incorporate plant based protein, such as beans, nuts, and seeds, into your meals. Protein is essential for building and repairing tissues, staying full, balancing blood sugar, as well as supporting immune function. Physical Activity: Aim for 150 minutes of physical activity week. Regular physical activity promotes overall health-including helping to reduce risk for heart disease and diabetes, promoting mental health, and helping us  sleep better.  Protein is a vital macronutrient in any diet, and it holds equal importance in a vegan diet. It's essential for numerous bodily functions, including: Building and repairing tissues: This includes muscles, organs, skin, hair, and nails. Producing enzymes and hormones: These regulate various bodily processes. Supporting immune function: Proteins are crucial for creating antibodies. Transporting nutrients: For example, hemoglobin, a protein, carries oxygen in the blood. Providing energy: While not its primary role, protein can be used for energy when needed. Satiety: Protein helps you feel fuller for longer, which can aid in weight management. While it's a common misconception that vegans struggle to get enough protein, a well-planned vegan diet can absolutely meet and exceed protein requirements. The key lies in understanding that protein is found in a wide  variety of plant-based foods,  and consuming a diverse range of these sources throughout the day ensures you obtain all the essential amino acids your body needs.  Handouts Provided Include  Using the Plate Method for a Balanced Meal Plan  Learning Style & Readiness for Change Teaching method utilized: Visual & Auditory  Demonstrated degree of understanding via: Teach Back  Barriers to learning/adherence to lifestyle change: ready  Goals Established by Pt Maintain current physical activity Aim for plant/vegetarian options that offers more protein (try lentil or chick pea pasta)    MONITORING & EVALUATION Dietary intake, weekly physical activity.  Next Steps  Patient is to follow-up in 2 months.

## 2023-07-08 ENCOUNTER — Encounter: Payer: Self-pay | Admitting: Dietician

## 2023-07-08 ENCOUNTER — Encounter: Attending: Family Medicine | Admitting: Dietician

## 2023-07-08 DIAGNOSIS — R7303 Prediabetes: Secondary | ICD-10-CM | POA: Insufficient documentation

## 2023-07-08 NOTE — Progress Notes (Signed)
 Medical Nutrition Therapy/ Employee visit follow-up  Appointment Start time:  [phone]  Appointment End time: 1546  Primary concerns today: recent weight gain; meal planning for a vegan diet Referral diagnosis: R73.03 Pre-diabetes Preferred learning style: no preference indicated (auditory, visual, hands on, no preference indicated) Learning readiness: ready (not ready, contemplating, ready, change in progress)  NUTRITION ASSESSMENT  Anthropometrics Weight: pt declined today Height: 65 in   Clinical Medical Hx:  Medications: multivitamin, vit c, fiber supplement (tablets), biotin, progesterone , estrogen, calcium Labs: Per pt A1c is around 5.8 (previous A1c 6.2 in 2021) Notable Signs/Symptoms: none noted  Lifestyle & Dietary Hx  Pt states she has finished with her trainer, stating she is on her own and going to the [facility]. Pt states she feels herself getting stronger. Pt states she has been eating Purple Carrot meals, stating she got them for a mother's day gift. Pt states she tried chick pea pasta, stating it was good. Pt states she likes Core Life and Cava for meals. Pt states she is doing line dancing, stating she is learning the steps.  Estimated daily fluid intake: 64 oz Supplements: vit C, multivitamin, fiber tablets, biotin Sleep: pretty good (7pm to 9 pm asleep and up at 6 am) Stress / self-care: 3 on a scale of 1-10; exercise (walking daily) Current average weekly physical activity: gym Monday thru Thursday (4 days per week), at least 60 minutes, machines for legs arms and stomach, walk the track...  line dancing on Fridays.  24-Hr Dietary Recall First Meal: coffee, toast (dave's killer bread) peanut butter or just egg, apple or orange Snack: cashews or peanuts Second Meal: noodles with vegetables and/or tofu Snack: cashews (unsalted raw) or almonds Third Meal: fried tofu, noodles and vegetables Snack: BBQ potato chips or skinny pop popcorn with tea Beverages: water,    NUTRITION DIAGNOSIS  NB-1.1 Food and nutrition-related knowledge deficit As related to recent life changes and prediabetes.  As evidenced by food recall and patient concerns.  NUTRITION INTERVENTION  Nutrition education (E-1) on the following topics:  Engaging in regular physical activity offers adults a multitude of health benefits, significantly enhancing overall well-being. It strengthens the cardiovascular system, reducing the risk of heart disease, stroke, and high blood pressure. Physical activity also builds and maintains strong bones and muscles, improving balance, mobility, and reducing the risk of falls and osteoporosis. Beyond physical improvements, it positively impacts mental health by reducing stress, anxiety, and symptoms of depression, while also boosting cognitive function and promoting better sleep quality. Furthermore, consistent movement aids in weight management and lowers the risk of developing chronic conditions such as type 2 diabetes and certain cancers, contributing to a longer, healthier, and more vibrant life.  Handouts Provided Include  Health Benefits of Physical Activity  Learning Style & Readiness for Change Teaching method utilized: Visual & Auditory  Demonstrated degree of understanding via: Teach Back  Barriers to learning/adherence to lifestyle change: ready  Goals Established by Pt Maintain current physical activity Aim for plant/vegetarian options that offers more protein (try lentil or chick pea pasta)   MONITORING & EVALUATION Dietary intake, weekly physical activity.  Next Steps  Patient will call for another appointment as needed.

## 2023-08-05 ENCOUNTER — Other Ambulatory Visit: Payer: Self-pay

## 2023-08-05 ENCOUNTER — Other Ambulatory Visit (HOSPITAL_COMMUNITY): Payer: Self-pay

## 2023-09-22 ENCOUNTER — Other Ambulatory Visit (HOSPITAL_COMMUNITY): Payer: Self-pay

## 2023-09-23 ENCOUNTER — Other Ambulatory Visit (HOSPITAL_COMMUNITY): Payer: Self-pay

## 2023-09-28 ENCOUNTER — Other Ambulatory Visit (HOSPITAL_COMMUNITY): Payer: Self-pay

## 2023-09-30 ENCOUNTER — Other Ambulatory Visit (HOSPITAL_COMMUNITY): Payer: Self-pay

## 2023-10-01 ENCOUNTER — Other Ambulatory Visit (HOSPITAL_COMMUNITY): Payer: Self-pay

## 2023-10-05 ENCOUNTER — Other Ambulatory Visit (HOSPITAL_COMMUNITY): Payer: Self-pay

## 2023-10-05 DIAGNOSIS — Z1231 Encounter for screening mammogram for malignant neoplasm of breast: Secondary | ICD-10-CM | POA: Diagnosis not present

## 2023-10-05 DIAGNOSIS — Z01419 Encounter for gynecological examination (general) (routine) without abnormal findings: Secondary | ICD-10-CM | POA: Diagnosis not present

## 2023-10-05 MED ORDER — ESTRADIOL 0.1 MG/GM VA CREA
TOPICAL_CREAM | VAGINAL | 4 refills | Status: AC
  Filled 2023-10-05: qty 42.5, 90d supply, fill #0

## 2023-10-05 MED ORDER — ESTRADIOL 0.5 MG PO TABS
0.5000 mg | ORAL_TABLET | Freq: Every day | ORAL | 4 refills | Status: AC
  Filled 2023-10-05: qty 95, 95d supply, fill #0
  Filled 2023-10-29: qty 90, 90d supply, fill #0
  Filled 2024-01-28: qty 90, 90d supply, fill #1

## 2023-10-05 MED ORDER — PROGESTERONE MICRONIZED 100 MG PO CAPS
100.0000 mg | ORAL_CAPSULE | Freq: Every day | ORAL | 3 refills | Status: AC
  Filled 2023-10-05: qty 95, 95d supply, fill #0

## 2023-10-05 MED ORDER — PROGESTERONE MICRONIZED 100 MG PO CAPS
100.0000 mg | ORAL_CAPSULE | Freq: Every day | ORAL | 4 refills | Status: AC
  Filled 2023-10-05: qty 90, 90d supply, fill #0
  Filled 2023-12-31: qty 90, 90d supply, fill #1

## 2023-10-06 ENCOUNTER — Other Ambulatory Visit (HOSPITAL_COMMUNITY): Payer: Self-pay

## 2023-10-06 ENCOUNTER — Other Ambulatory Visit: Payer: Self-pay

## 2023-10-11 DIAGNOSIS — M222X1 Patellofemoral disorders, right knee: Secondary | ICD-10-CM | POA: Diagnosis not present

## 2023-10-11 DIAGNOSIS — R2689 Other abnormalities of gait and mobility: Secondary | ICD-10-CM | POA: Diagnosis not present

## 2023-10-11 DIAGNOSIS — Z6827 Body mass index (BMI) 27.0-27.9, adult: Secondary | ICD-10-CM | POA: Diagnosis not present

## 2023-10-12 ENCOUNTER — Other Ambulatory Visit (HOSPITAL_COMMUNITY): Payer: Self-pay

## 2023-10-13 ENCOUNTER — Other Ambulatory Visit (HOSPITAL_COMMUNITY): Payer: Self-pay

## 2023-10-13 MED ORDER — COVID-19 MRNA VAC-TRIS(PFIZER) 30 MCG/0.3ML IM SUSY: 0.0300 mL | PREFILLED_SYRINGE | Freq: Once | INTRAMUSCULAR | 0 refills | Status: AC

## 2023-10-13 MED ORDER — FLUCONAZOLE 150 MG PO TABS
150.0000 mg | ORAL_TABLET | ORAL | 0 refills | Status: AC
  Filled 2023-10-13: qty 2, 3d supply, fill #0

## 2023-10-13 MED ORDER — FLUCONAZOLE 150 MG PO TABS
150.0000 mg | ORAL_TABLET | ORAL | 0 refills | Status: AC
  Filled 2023-10-13: qty 1, 3d supply, fill #0

## 2023-10-14 ENCOUNTER — Other Ambulatory Visit (HOSPITAL_COMMUNITY): Payer: Self-pay

## 2023-10-14 MED ORDER — COVID-19 MRNA VAC-TRIS(PFIZER) 30 MCG/0.3ML IM SUSY
0.3000 mL | PREFILLED_SYRINGE | Freq: Once | INTRAMUSCULAR | 0 refills | Status: AC
  Filled 2023-10-14: qty 0.3, 1d supply, fill #0

## 2023-10-29 ENCOUNTER — Other Ambulatory Visit (HOSPITAL_COMMUNITY): Payer: Self-pay

## 2023-10-29 ENCOUNTER — Other Ambulatory Visit: Payer: Self-pay

## 2023-11-08 DIAGNOSIS — M25561 Pain in right knee: Secondary | ICD-10-CM | POA: Diagnosis not present

## 2023-11-08 DIAGNOSIS — M25562 Pain in left knee: Secondary | ICD-10-CM | POA: Diagnosis not present

## 2023-11-15 DIAGNOSIS — Z0000 Encounter for general adult medical examination without abnormal findings: Secondary | ICD-10-CM | POA: Diagnosis not present

## 2023-11-15 DIAGNOSIS — M222X1 Patellofemoral disorders, right knee: Secondary | ICD-10-CM | POA: Diagnosis not present

## 2023-11-15 DIAGNOSIS — Z13220 Encounter for screening for lipoid disorders: Secondary | ICD-10-CM | POA: Diagnosis not present

## 2023-11-15 DIAGNOSIS — F4321 Adjustment disorder with depressed mood: Secondary | ICD-10-CM | POA: Diagnosis not present

## 2023-11-15 DIAGNOSIS — R7303 Prediabetes: Secondary | ICD-10-CM | POA: Diagnosis not present

## 2023-11-16 ENCOUNTER — Other Ambulatory Visit (HOSPITAL_COMMUNITY): Payer: Self-pay

## 2023-11-16 DIAGNOSIS — M25562 Pain in left knee: Secondary | ICD-10-CM | POA: Diagnosis not present

## 2023-11-16 DIAGNOSIS — M25561 Pain in right knee: Secondary | ICD-10-CM | POA: Diagnosis not present

## 2023-11-16 MED ORDER — WEGOVY 0.25 MG/0.5ML ~~LOC~~ SOAJ
0.2500 mg | SUBCUTANEOUS | 1 refills | Status: AC
  Filled 2023-11-16 – 2023-11-19 (×2): qty 2, 28d supply, fill #0

## 2023-11-17 ENCOUNTER — Other Ambulatory Visit (HOSPITAL_COMMUNITY): Payer: Self-pay

## 2023-11-19 ENCOUNTER — Other Ambulatory Visit (HOSPITAL_COMMUNITY): Payer: Self-pay

## 2023-11-22 DIAGNOSIS — M25562 Pain in left knee: Secondary | ICD-10-CM | POA: Diagnosis not present

## 2023-11-22 DIAGNOSIS — M25561 Pain in right knee: Secondary | ICD-10-CM | POA: Diagnosis not present

## 2023-11-29 DIAGNOSIS — M25561 Pain in right knee: Secondary | ICD-10-CM | POA: Diagnosis not present

## 2023-11-29 DIAGNOSIS — M25562 Pain in left knee: Secondary | ICD-10-CM | POA: Diagnosis not present

## 2023-12-08 DIAGNOSIS — M25561 Pain in right knee: Secondary | ICD-10-CM | POA: Diagnosis not present

## 2023-12-08 DIAGNOSIS — M25562 Pain in left knee: Secondary | ICD-10-CM | POA: Diagnosis not present

## 2023-12-20 DIAGNOSIS — M25561 Pain in right knee: Secondary | ICD-10-CM | POA: Diagnosis not present

## 2023-12-20 DIAGNOSIS — M25562 Pain in left knee: Secondary | ICD-10-CM | POA: Diagnosis not present

## 2023-12-24 ENCOUNTER — Other Ambulatory Visit (HOSPITAL_COMMUNITY): Payer: Self-pay

## 2023-12-27 DIAGNOSIS — R7303 Prediabetes: Secondary | ICD-10-CM | POA: Diagnosis not present

## 2023-12-28 ENCOUNTER — Other Ambulatory Visit (HOSPITAL_COMMUNITY): Payer: Self-pay

## 2024-01-03 ENCOUNTER — Other Ambulatory Visit (HOSPITAL_COMMUNITY): Payer: Self-pay

## 2024-01-07 ENCOUNTER — Other Ambulatory Visit (HOSPITAL_COMMUNITY): Payer: Self-pay

## 2024-01-12 ENCOUNTER — Other Ambulatory Visit (HOSPITAL_COMMUNITY): Payer: Self-pay

## 2024-01-15 ENCOUNTER — Other Ambulatory Visit (HOSPITAL_COMMUNITY): Payer: Self-pay

## 2024-01-28 ENCOUNTER — Other Ambulatory Visit: Payer: Self-pay

## 2024-01-28 ENCOUNTER — Other Ambulatory Visit (HOSPITAL_COMMUNITY): Payer: Self-pay

## 2024-05-24 ENCOUNTER — Ambulatory Visit: Admitting: Physician Assistant
